# Patient Record
Sex: Male | Born: 1980 | Race: White | Hispanic: No | Marital: Single | Smoking: Never smoker
Health system: Southern US, Community
[De-identification: ages and names within clinical notes are randomized; demographics above are authoritative.]

## PROBLEM LIST (undated history)

## (undated) DIAGNOSIS — K219 Gastro-esophageal reflux disease without esophagitis: Secondary | ICD-10-CM

## (undated) DIAGNOSIS — T8859XA Other complications of anesthesia, initial encounter: Secondary | ICD-10-CM

## (undated) DIAGNOSIS — M419 Scoliosis, unspecified: Secondary | ICD-10-CM

## (undated) DIAGNOSIS — E781 Pure hyperglyceridemia: Secondary | ICD-10-CM

## (undated) DIAGNOSIS — F79 Unspecified intellectual disabilities: Secondary | ICD-10-CM

## (undated) DIAGNOSIS — T148XXA Other injury of unspecified body region, initial encounter: Secondary | ICD-10-CM

## (undated) DIAGNOSIS — F209 Schizophrenia, unspecified: Secondary | ICD-10-CM

## (undated) DIAGNOSIS — Z9889 Other specified postprocedural states: Secondary | ICD-10-CM

## (undated) DIAGNOSIS — T4145XA Adverse effect of unspecified anesthetic, initial encounter: Secondary | ICD-10-CM

## (undated) HISTORY — PX: WISDOM TOOTH EXTRACTION: SHX21

## (undated) HISTORY — DX: Unspecified intellectual disabilities: F79

## (undated) HISTORY — DX: Other specified postprocedural states: Z98.890

## (undated) HISTORY — PX: TONSILLECTOMY AND ADENOIDECTOMY: SHX28

## (undated) HISTORY — DX: Scoliosis, unspecified: M41.9

## (undated) HISTORY — PX: BACK SURGERY: SHX140

## (undated) HISTORY — PX: EYE SURGERY: SHX253

---
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
## 2005-10-25 ENCOUNTER — Encounter: Admission: RE | Admit: 2005-10-25 | Discharge: 2005-10-25 | Payer: Self-pay | Admitting: Orthopaedic Surgery

## 2007-06-18 IMAGING — CT CT L SPINE W/O CM
4 of 5 series · 16 of 33 positions shown, 19 images · IV contrast (agent unspecified)
Comparison: None.
COMPARISON: None.

CLINICAL DATA: Chronic back pain post Harrington rod fusion, 0886, for scoliosis.
THORACIC SPINE CT WITHOUT CONTRAST:
TECHNIQUE: Multidetector CT imaging of the thoracic spine was performed.  Multiplanar CT images were also generated.
TECHNIQUE: Multidetector CT imaging of the lumbar spine was performed.  Multiplanar CT image reconstructions were also generated.

[Series 3: recon 2: l-spine helical · axial · 0.27mm/px · z∈[-276,-103]mm · 2 of 209 slices shown]
[im 70/209  bone]
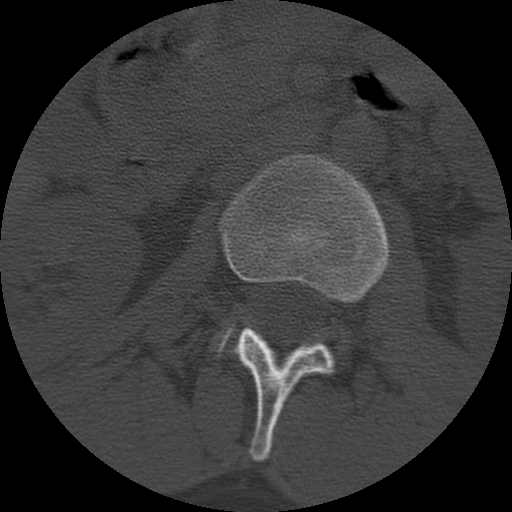
[im 139/209  bone]
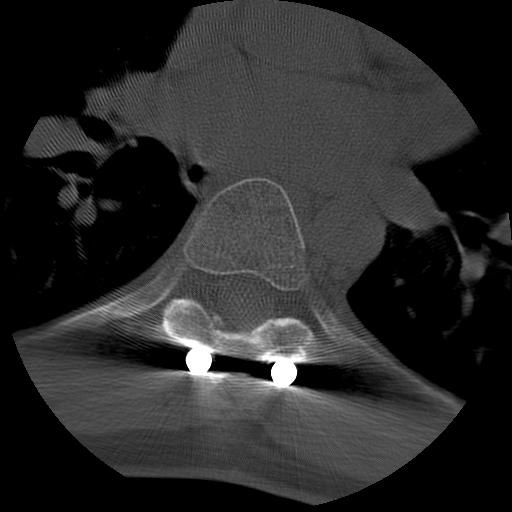

[Series 4: recon 3: l-spine helical · axial · 0.27mm/px · z∈[-374,-3]mm · 6 of 417 slices shown, 8 images]
[im 60/417  soft-tissue]
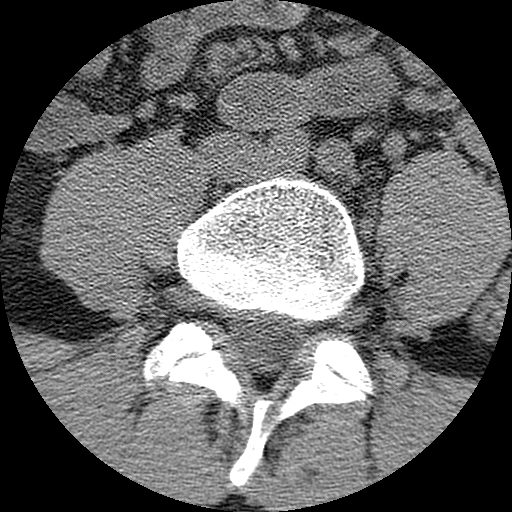
[im 60/417  bone]
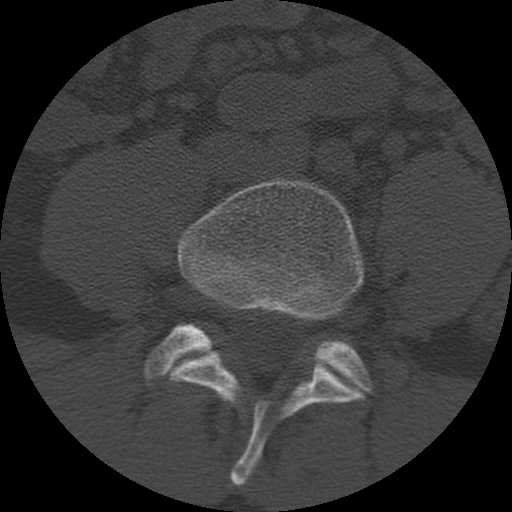
[im 119/417  bone]
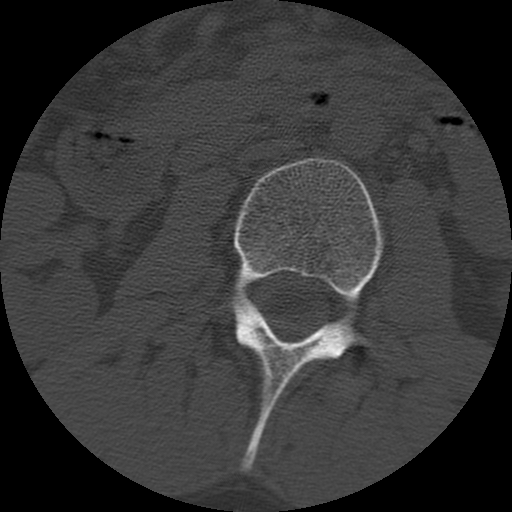
[im 179/417  bone]
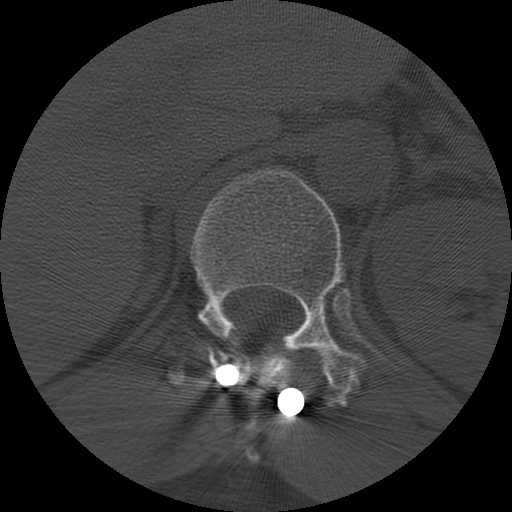
[im 238/417  bone]
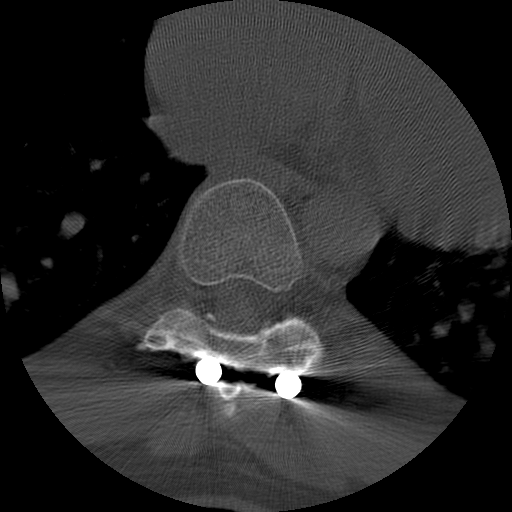
[im 298/417  soft-tissue]
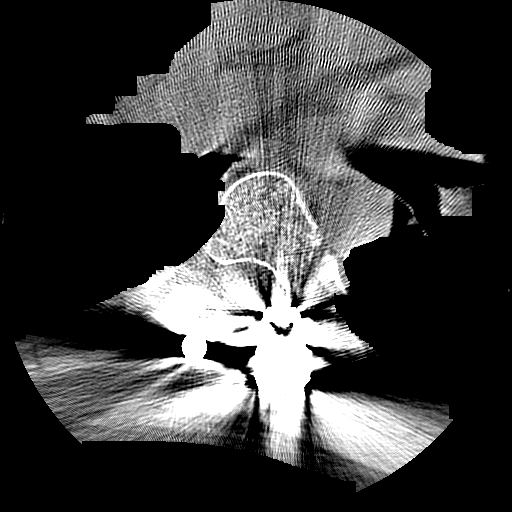
[im 298/417  bone]
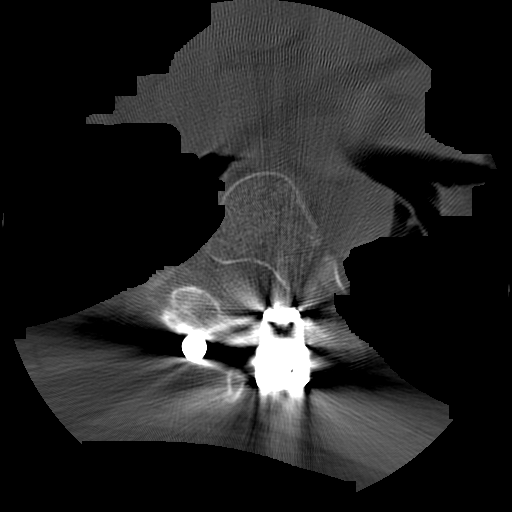
[im 357/417  bone]
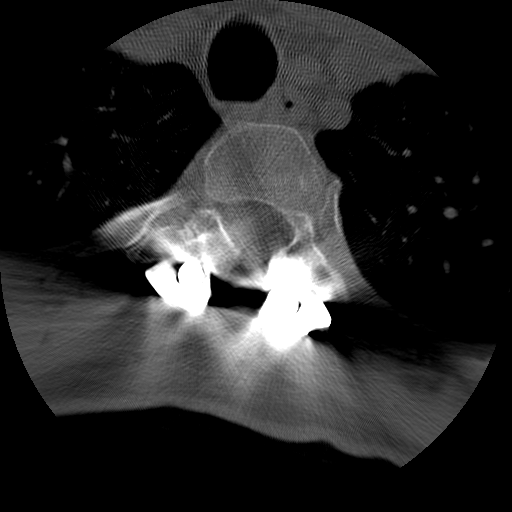

[Series 251: reformatted · sagittal · 1.02mm/px · 5 of 40 slices shown, 6 images (1 of 2)]
[im 14/40  bone]
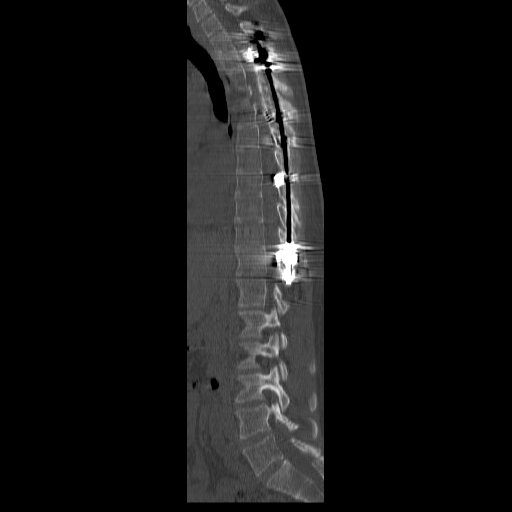
[im 17/40  bone]
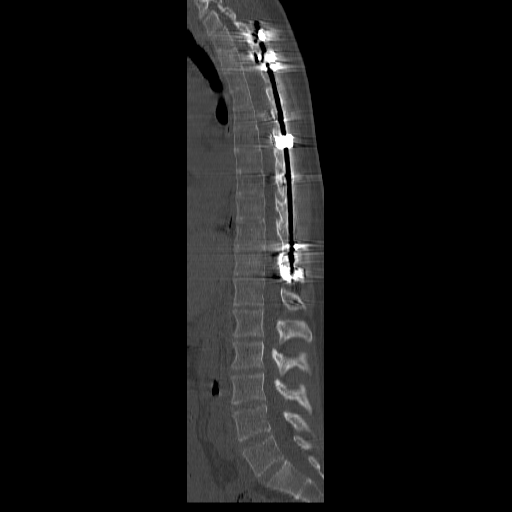
[im 20/40  soft-tissue]
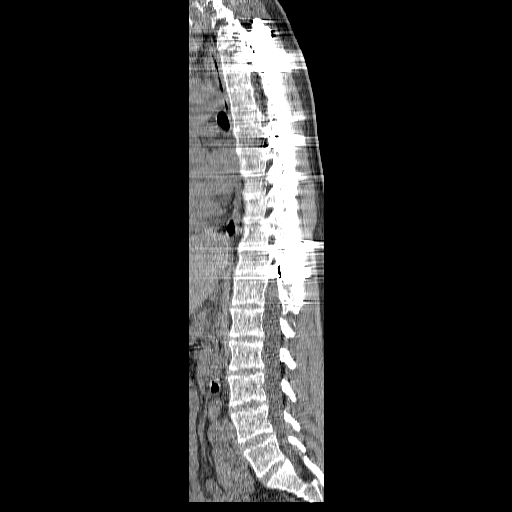
[im 20/40  bone]
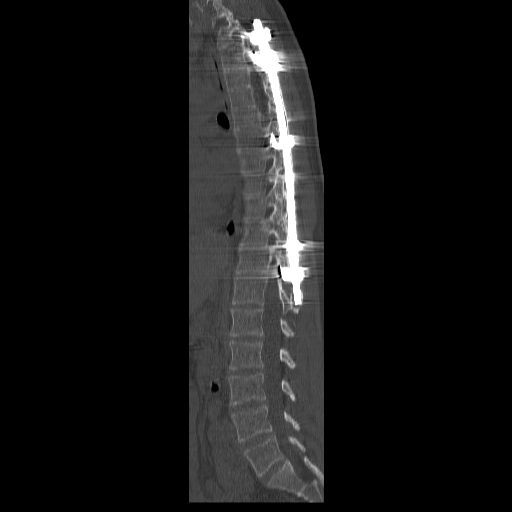
[im 23/40  bone]
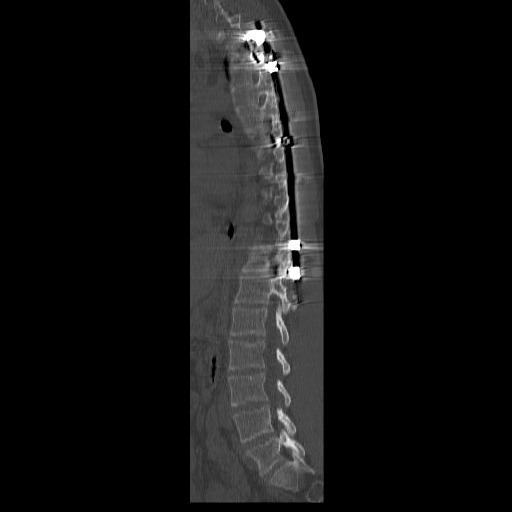
[im 27/40  bone]
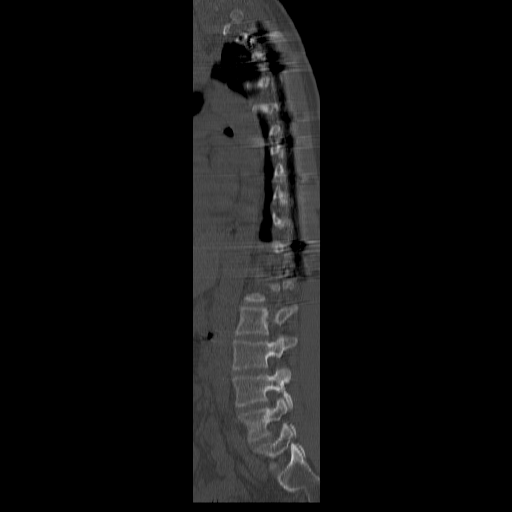

[Series 252: reformatted · coronal · 1.02mm/px · 3 of 40 slices shown (2 of 2)]
[im 8/40  bone]
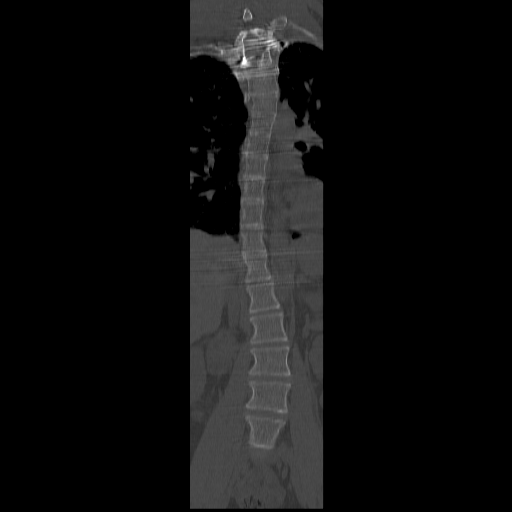
[im 16/40  bone]
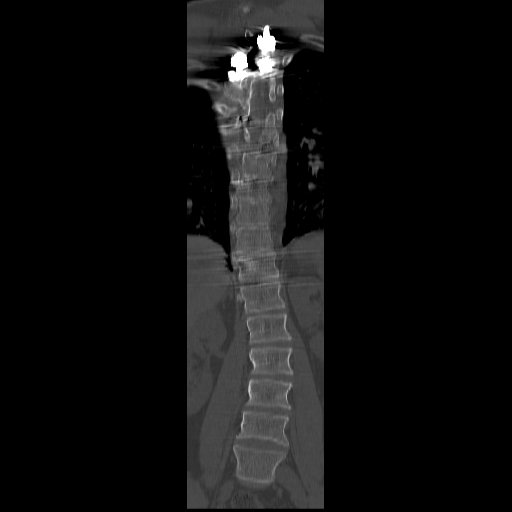
[im 24/40  bone]
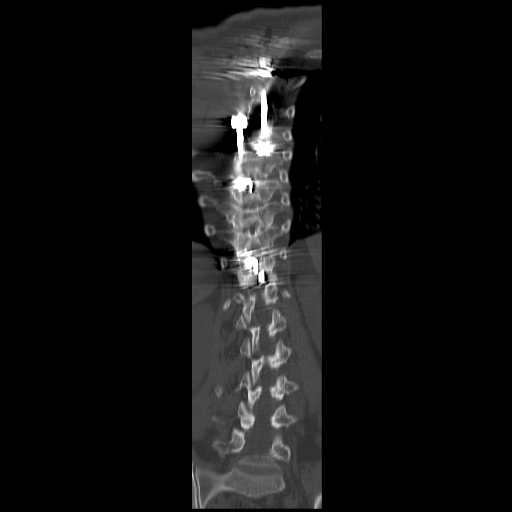

[16 of 33 positions shown; findings below may reference images not displayed]

Lira distraction rods are seen from the level of T1 through T11 with slight levorotary scoliosis at the lumbar spine and dextroscoliosis of the thoracic spine.  Streak artifact from the Lira distraction rods is most marked at the T1, T2, T6, T8, and T11 levels.  Allowing for this, no significant osseous lesion nor central canal stenosis/significant neural impingement is seen.  No paravertebral abnormality is seen by CT assessment.
IMPRESSION: 1.  Thoracic distraction Harrington rods with slight dextroscoliosis of thoracic spine.
2.  No significant abnormality seen allowing for Harrington rod metallic streak artifact.
LUMBAR SPINE CT WITHOUT CONTRAST:
T12-L1, L1-2, L2-3, L3-4:  The disks appear normal.  No neural impingement nor acquired spinal stenosis nor significant osseous abnormality is seen.
L4-5:  Slight diffuse annular disk bulge is seen with no significant neural impingement nor acquired spinal stenosis.  No significant osseous abnormality is seen.
L5-S1:  The disk is normal appearing with no neural impingement, acquired spinal stenosis, nor significant osseous abnormality.  
Posterior vertebral alignment is normally maintained with slight levorotary scoliosis and no significant soft tissue abnormality in the paravertebral region.
IMPRESSION: 1.  Slight levoscoliosis.
2.  Otherwise no significant abnormality.

## 2008-05-04 ENCOUNTER — Emergency Department (HOSPITAL_COMMUNITY): Admission: EM | Admit: 2008-05-04 | Discharge: 2008-05-04 | Payer: Self-pay | Admitting: Emergency Medicine

## 2009-03-13 ENCOUNTER — Emergency Department (HOSPITAL_COMMUNITY): Admission: EM | Admit: 2009-03-13 | Discharge: 2009-03-14 | Payer: Self-pay | Admitting: Emergency Medicine

## 2009-03-14 ENCOUNTER — Inpatient Hospital Stay (HOSPITAL_COMMUNITY): Admission: AD | Admit: 2009-03-14 | Discharge: 2009-03-17 | Payer: Self-pay | Admitting: Psychiatry

## 2009-03-14 ENCOUNTER — Ambulatory Visit: Payer: Self-pay | Admitting: Psychiatry

## 2010-07-21 LAB — DIFFERENTIAL
Basophils Absolute: 0 10*3/uL (ref 0.0–0.1)
Eosinophils Absolute: 0.1 10*3/uL (ref 0.0–0.7)
Eosinophils Relative: 1 % (ref 0–5)
Monocytes Absolute: 0.8 10*3/uL (ref 0.1–1.0)
Monocytes Relative: 9 % (ref 3–12)
Neutro Abs: 4.7 10*3/uL (ref 1.7–7.7)

## 2010-07-21 LAB — CBC
Hemoglobin: 14.6 g/dL (ref 13.0–17.0)
Platelets: 267 10*3/uL (ref 150–400)
RBC: 4.53 MIL/uL (ref 4.22–5.81)

## 2010-07-21 LAB — BASIC METABOLIC PANEL
Calcium: 9.3 mg/dL (ref 8.4–10.5)
Chloride: 102 mEq/L (ref 96–112)
Creatinine, Ser: 0.79 mg/dL (ref 0.4–1.5)

## 2010-07-21 LAB — ETHANOL: Alcohol, Ethyl (B): <5 mg/dL (ref 0–10)

## 2010-07-21 LAB — URINALYSIS, ROUTINE W REFLEX MICROSCOPIC
Glucose, UA: NEGATIVE mg/dL
Leukocytes, UA: NEGATIVE
Specific Gravity, Urine: 1.029 (ref 1.005–1.030)
Urobilinogen, UA: 2 mg/dL — ABNORMAL HIGH (ref 0.0–1.0)
pH: 6.5 (ref 5.0–8.0)

## 2010-07-21 LAB — RAPID URINE DRUG SCREEN, HOSP PERFORMED
Barbiturates: NOT DETECTED
Cocaine: NOT DETECTED

## 2010-07-21 LAB — URINE MICROSCOPIC-ADD ON

## 2010-08-02 LAB — DIFFERENTIAL
Basophils Absolute: 0 10*3/uL (ref 0.0–0.1)
Basophils Relative: 0 % (ref 0–1)
Lymphocytes Relative: 32 % (ref 12–46)
Lymphs Abs: 2.6 10*3/uL (ref 0.7–4.0)
Neutro Abs: 4.7 10*3/uL (ref 1.7–7.7)

## 2010-08-02 LAB — COMPREHENSIVE METABOLIC PANEL
AST: 23 U/L (ref 0–37)
BUN: 6 mg/dL (ref 6–23)
CO2: 27 mEq/L (ref 19–32)
Calcium: 9.5 mg/dL (ref 8.4–10.5)
Creatinine, Ser: 0.78 mg/dL (ref 0.4–1.5)
GFR calc Af Amer: >60 mL/min (ref >60)
Potassium: 3.6 mEq/L (ref 3.5–5.1)
Sodium: 138 mEq/L (ref 135–145)
Total Bilirubin: 1.8 mg/dL — ABNORMAL HIGH (ref 0.3–1.2)
Total Protein: 7 g/dL (ref 6.0–8.3)

## 2010-08-02 LAB — RAPID URINE DRUG SCREEN, HOSP PERFORMED
Barbiturates: NOT DETECTED
Opiates: NOT DETECTED

## 2010-08-02 LAB — CBC
HCT: 43.3 % (ref 39.0–52.0)
Hemoglobin: 14.5 g/dL (ref 13.0–17.0)
MCHC: 33.4 g/dL (ref 30.0–36.0)
MCV: 95.2 fL (ref 78.0–100.0)
RBC: 4.54 MIL/uL (ref 4.22–5.81)

## 2011-05-26 ENCOUNTER — Other Ambulatory Visit: Payer: Self-pay | Admitting: Family Medicine

## 2011-05-26 ENCOUNTER — Ambulatory Visit (INDEPENDENT_AMBULATORY_CARE_PROVIDER_SITE_OTHER): Payer: Medicare Other | Admitting: Family Medicine

## 2011-05-26 VITALS — BP 146/81 | HR 94 | Temp 98.0°F | Resp 16 | Ht 70.5 in | Wt 166.6 lb

## 2011-05-26 DIAGNOSIS — R634 Abnormal weight loss: Secondary | ICD-10-CM

## 2011-05-26 DIAGNOSIS — F79 Unspecified intellectual disabilities: Secondary | ICD-10-CM

## 2011-05-26 DIAGNOSIS — R631 Polydipsia: Secondary | ICD-10-CM

## 2011-05-26 DIAGNOSIS — R350 Frequency of micturition: Secondary | ICD-10-CM

## 2011-05-26 LAB — POCT CBC
HCT, POC: 42.1 % — AB (ref 43.5–53.7)
MCV: 93.4 fL (ref 80–97)
POC MID %: 7.5 %M (ref 0–12)
Platelet Count, POC: 276 10*3/uL (ref 142–424)

## 2011-05-26 LAB — POCT GLYCOSYLATED HEMOGLOBIN (HGB A1C): Hemoglobin A1C: 5.3
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
## 2011-05-28 LAB — COMPREHENSIVE METABOLIC PANEL
AST: 18 U/L (ref 0–37)
Albumin: 5 g/dL (ref 3.5–5.2)
Alkaline Phosphatase: 68 U/L (ref 39–117)
Creat: 0.89 mg/dL (ref 0.50–1.35)
Glucose, Bld: 96 mg/dL (ref 70–99)
Potassium: 4.2 mEq/L (ref 3.5–5.3)
Sodium: 141 mEq/L (ref 135–145)

## 2011-05-30 ENCOUNTER — Encounter: Payer: Self-pay | Admitting: Family Medicine

## 2011-05-31 LAB — TSH: TSH: 1.89 u[IU]/mL (ref 0.350–4.500)

## 2011-06-18 ENCOUNTER — Inpatient Hospital Stay (HOSPITAL_COMMUNITY)
Admission: RE | Admit: 2011-06-18 | Discharge: 2011-06-24 | DRG: 885 | Disposition: A | Discharge status: Home / Self Care | Payer: No Typology Code available for payment source | Source: Ambulatory Visit | Attending: Psychiatry | Admitting: Psychiatry

## 2011-06-18 ENCOUNTER — Encounter (HOSPITAL_COMMUNITY): Payer: Self-pay | Admitting: *Deleted

## 2011-06-18 ENCOUNTER — Emergency Department (HOSPITAL_COMMUNITY)
Admission: EM | Admit: 2011-06-18 | Discharge: 2011-06-18 | Disposition: A | Discharge status: Other Acute Inpatient Hospital | Payer: Medicare Other | Attending: Emergency Medicine | Admitting: Emergency Medicine

## 2011-06-18 ENCOUNTER — Encounter (HOSPITAL_COMMUNITY): Payer: Self-pay | Admitting: Emergency Medicine

## 2011-06-18 DIAGNOSIS — M4120 Other idiopathic scoliosis, site unspecified: Secondary | ICD-10-CM

## 2011-06-18 DIAGNOSIS — Z79899 Other long term (current) drug therapy: Secondary | ICD-10-CM

## 2011-06-18 DIAGNOSIS — F29 Unspecified psychosis not due to a substance or known physiological condition: Secondary | ICD-10-CM | POA: Insufficient documentation

## 2011-06-18 DIAGNOSIS — F79 Unspecified intellectual disabilities: Secondary | ICD-10-CM | POA: Insufficient documentation

## 2011-06-18 DIAGNOSIS — F200 Paranoid schizophrenia: Principal | ICD-10-CM

## 2011-06-18 DIAGNOSIS — Z91018 Allergy to other foods: Secondary | ICD-10-CM

## 2011-06-18 LAB — CBC
HCT: 45 % (ref 39.0–52.0)
MCH: 32.1 pg (ref 26.0–34.0)
MCHC: 35.8 g/dL (ref 30.0–36.0)
RBC: 5.02 MIL/uL (ref 4.22–5.81)
RDW: 11.8 % (ref 11.5–15.5)
WBC: 11.5 10*3/uL — ABNORMAL HIGH (ref 4.0–10.5)

## 2011-06-18 LAB — URINE MICROSCOPIC-ADD ON

## 2011-06-18 LAB — URINALYSIS, ROUTINE W REFLEX MICROSCOPIC
Bilirubin Urine: NEGATIVE
Glucose, UA: NEGATIVE mg/dL
Nitrite: NEGATIVE

## 2011-06-18 LAB — BASIC METABOLIC PANEL
Calcium: 9.5 mg/dL (ref 8.4–10.5)
Chloride: 99 mEq/L (ref 96–112)
GFR calc Af Amer: >90 mL/min (ref >90)
Glucose, Bld: 109 mg/dL — ABNORMAL HIGH (ref 70–99)

## 2011-06-18 LAB — RAPID URINE DRUG SCREEN, HOSP PERFORMED
Opiates: NOT DETECTED
Tetrahydrocannabinol: NOT DETECTED

## 2011-06-18 MED ORDER — RISPERIDONE 2 MG PO TABS
2.0000 mg | ORAL_TABLET | Freq: Every day | ORAL | Status: DC
  Administered 2011-06-18 – 2011-06-23 (×6): 2 mg via ORAL
  Filled 2011-06-18: qty 1
  Filled 2011-06-18: qty 10
  Filled 2011-06-18 (×6): qty 1

## 2011-06-18 MED ORDER — ZOLPIDEM TARTRATE 5 MG PO TABS: 5.0000 mg | ORAL_TABLET | Freq: Every evening | ORAL | Status: DC | PRN

## 2011-06-18 MED ORDER — MAGNESIUM HYDROXIDE 400 MG/5ML PO SUSP: 30.0000 mL | Freq: Every day | ORAL | Status: DC | PRN

## 2011-06-18 MED ORDER — MAGNESIUM HYDROXIDE 400 MG/5ML PO SUSP
30.0000 mL | Freq: Every day | ORAL | Status: DC | PRN
  Filled 2011-06-18: qty 30

## 2011-06-18 MED ORDER — ALUM & MAG HYDROXIDE-SIMETH 200-200-20 MG/5ML PO SUSP: 30.0000 mL | ORAL | Status: DC | PRN

## 2011-06-18 MED ORDER — BENZTROPINE MESYLATE 1 MG PO TABS: 2.0000 mg | ORAL_TABLET | Freq: Four times a day (QID) | ORAL | Status: DC | PRN

## 2011-06-18 MED ORDER — ACETAMINOPHEN 325 MG PO TABS: 650.0000 mg | ORAL_TABLET | ORAL | Status: DC | PRN

## 2011-06-18 MED ORDER — IBUPROFEN 600 MG PO TABS
600.0000 mg | ORAL_TABLET | Freq: Three times a day (TID) | ORAL | Status: DC | PRN
  Administered 2011-06-22 – 2011-06-23 (×3): 600 mg via ORAL
  Filled 2011-06-18 (×3): qty 1

## 2011-06-18 MED ORDER — LORAZEPAM 1 MG PO TABS: 1.0000 mg | ORAL_TABLET | Freq: Three times a day (TID) | ORAL | Status: DC | PRN

## 2011-06-18 MED ORDER — NICOTINE 21 MG/24HR TD PT24: 21.0000 mg | MEDICATED_PATCH | Freq: Every day | TRANSDERMAL | Status: DC

## 2011-06-18 MED ORDER — POTASSIUM CHLORIDE CRYS ER 20 MEQ PO TBCR
40.0000 meq | EXTENDED_RELEASE_TABLET | Freq: Once | ORAL | Status: AC
  Administered 2011-06-18: 40 meq via ORAL
  Filled 2011-06-18: qty 2

## 2011-06-18 MED ORDER — CIPROFLOXACIN HCL 500 MG PO TABS
500.0000 mg | ORAL_TABLET | Freq: Two times a day (BID) | ORAL | Status: DC
  Administered 2011-06-18 – 2011-06-21 (×7): 500 mg via ORAL
  Filled 2011-06-18 (×8): qty 1

## 2011-06-18 MED ORDER — ACETAMINOPHEN 325 MG PO TABS: 650.0000 mg | ORAL_TABLET | Freq: Four times a day (QID) | ORAL | Status: DC | PRN

## 2011-06-18 MED ORDER — CIPROFLOXACIN HCL 500 MG PO TABS
500.0000 mg | ORAL_TABLET | Freq: Once | ORAL | Status: AC
  Administered 2011-06-18: 500 mg via ORAL
  Filled 2011-06-18: qty 1

## 2011-06-18 MED ORDER — ONDANSETRON HCL 4 MG PO TABS: 4.0000 mg | ORAL_TABLET | Freq: Three times a day (TID) | ORAL | Status: DC | PRN

## 2011-06-18 MED ORDER — IBUPROFEN 600 MG PO TABS: 600.0000 mg | ORAL_TABLET | Freq: Three times a day (TID) | ORAL | Status: DC | PRN

## 2011-06-18 MED ORDER — CIPROFLOXACIN HCL 500 MG PO TABS: 500.0000 mg | ORAL_TABLET | Freq: Two times a day (BID) | ORAL | Status: DC

## 2011-06-18 NOTE — ED Notes (Signed)
Greg CSW into see 
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
## 2011-06-18 NOTE — ED Notes (Signed)
1 bag of belongings sent w/ pt 

## 2011-06-18 NOTE — ED Notes (Signed)
[facility] dept here to transport  
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
## 2011-06-18 NOTE — ED Notes (Signed)
Up to the bathroom 

## 2011-06-18 NOTE — ED Notes (Signed)
Pt ambulatory w/ [facility] to [facility]
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
## 2011-06-18 NOTE — Progress Notes (Signed)
Patient ID: Andrew Conley, male   DOB: January 14, 1981, 31 y.o.   MRN: [id number] The patient has a flat affect. He makes only brief eye contact when he speaks. Admits to auditory hallucinations that he believes are coming from a chip implanted in his tooth. He feels taking medicine is pointless and his only hope of getting relief is to have someone remove the chip. He reports that he read an article in [employer] where a woman had a similar thing happen to her and how she had the chip removed, so he knows it is possible. Although he does not believe that medicine will help him, he was compliant with taking medication. Encouraged to increase fluid intake due to his h/o UTI.

## 2011-06-18 NOTE — ED Notes (Signed)
MD at bedside. 
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
## 2011-06-18 NOTE — Progress Notes (Signed)
Patient ID: NIKAN ELLINGSON, male   DOB: 12-04-1980, 31 y.o.   MRN: [id number]  [facility] Notes:  (Counselor/Nursing/MHT/Case Management/Adjunct)  06/18/2011 11 AM  Type of Therapy:  Group Therapy, Dance/Movement Therapy   Participation Level:  Did Not Attend     Rhunette Croft

## 2011-06-19 DIAGNOSIS — F200 Paranoid schizophrenia: Secondary | ICD-10-CM

## 2011-06-19 NOTE — [facility] Counselor (Signed)
Adult Comprehensive Assessment  Patient ID: Andrew Conley, male   DOB: 08-19-1980, 31 y.o.   MRN: [id number]  Information Source: Information source: Patient  Current Stressors:  Educational / Learning stressors: H.S diploma, attended community college Employment / Job issues: Unemployed-disabled Family Relationships: Pt. reports no problems Financial / Lack of resources (include bankruptcy): pt. reports no problems Housing / Lack of housing: Pt. lives with his parents Physical health (include injuries & life threatening diseases): Pt. has no problems Substance abuse: Drink alchol 5 times a week Bereavement / Loss: Pt has no problems  Living/Environment/Situation:  Living Arrangements: Parent Living conditions (as described by patient or guardian): Pt. likes where he lives How long has patient lived in current situation?: 31 years What is atmosphere in current home: Supportive  Family History:  Marital status: Single Does patient have children?: No  Childhood History:  By whom was/is the patient raised?: Both parents Additional childhood history information: Pt. reports a good childhood Description of patient's relationship with caregiver when they were a child: Pt. reports being close to his parents Patient's description of current relationship with people who raised him/her: Pt. reports good relationship Does patient have siblings?: Yes Number of Siblings: 1  (Brother) Description of patient's current relationship with siblings: Pt. 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 and [place]  Employment/Work Situation:   Employment situation: On disability Why is patient on disability: Spinal contion and mental health issues/ and learning disability How long has patient been on disability: 3 years Patient's job has been impacted by current illness: No 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 reports drinking occasionally If attempted suicide, did drugs/alcohol play a role in this?: No Alcohol/Substance Abuse Treatment Hx: Denies past history If yes, describe treatment: No problems Has alcohol/substance abuse ever caused legal problems?: No  Social Support System:   Patient's Community Support System: Fair Museum/gallery exhibitions officer System: Pt. reports fair support Type of faith/religion: Metoodist How does patient's faith help to cope with current illness?: Pt. attends church and says it helps  Leisure/Recreation:   Leisure and Hobbies: Social research officer, government,   Strengths/Needs:   What things does the patient do well?: Gardening In what areas does patient struggle / problems for patient: Pt. reports no struggles  Discharge Plan:   Does patient have access to transportation?: Yes (Pt.'s parents will pick up at d/c) Will patient be returning to same living situation after discharge?: Yes Currently receiving community mental health services: No If no, would patient like referral for  services when discharged?: Yes (What county?) (Pt. lives in [place].)  Does patient have financial barriers related to discharge medications?: No  Summary/Recommendations:   Summary and Recommendations (to be completed by the evaluator): Pt. is a 31 year old male admitted for parinoia. Pt. does not have a doctor or therapist but would like a referral for one. Pt. lives in [place]. Pt. recommendations include: crisis stablization, case mangement, group therpy, and medication management.  Andrew Conley [place]. 06/19/2011
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
## 2011-06-19 NOTE — H&P (Signed)
  Pt was seen by me today and I agree with the key elements documented in H&P.  

## 2011-06-19 NOTE — Progress Notes (Signed)
Pt is calm and cooperative   He has been quiet and has limited interaction with others  He attends and minimally participates in groups  He is guarded and suspicious  He is eating meals and snacks and has taken his medications    Verbal support given  Medications administered and effectiveness monitored  Q 15 min checks  Pt safe

## 2011-06-19 NOTE — [facility] Suicide Risk Assessment (Signed)
Suicide Risk Assessment  Admission Assessment     Demographic factors:  Assessment Details Time of Assessment: Admission Information Obtained From: Patient Current Mental Status:  Current Mental Status: Thoughts of violence towards others Loss Factors:  Loss Factors:  (unble to assess) Historical Factors:  Historical Factors: Impulsivity, history to threatning other in past Risk Reduction Factors:  Risk Reduction Factors: Sense of responsibility to family;Religious beliefs about death;Living with another person, especially a relative;Positive social support, family support  CLINICAL FACTORS:   Previous Psychiatric Diagnoses and Treatments  COGNITIVE FEATURES THAT CONTRIBUTE TO RISK:  Closed-mindedness    SUICIDE RISK:   Mild:  Suicidal ideation of limited frequency, intensity, duration, and specificity.  There are no identifiable plans, no associated intent, mild dysphoria and related symptoms, good self-control (both objective and subjective assessment), few other risk factors, and identifiable protective factors, including available and accessible social support.  PLAN OF CARE:  Daily contact with patient to assess and evaluate symptoms and progress in treatment  Medication management.  Currently on antibiotics for uti.  Wonda Cerise 06/19/2011, 10:51 PM

## 2011-06-19 NOTE — Progress Notes (Signed)
[facility] Adult Inpatient Family/Significant Other Suicide Prevention Education  Suicide Prevention Education:  Contact Attempts:  Darel Hong [phone]-(mother)has been identified by the patient as the family member/significant other with whom the patient will be residing, and identified as the person(s) who will aid the patient in the event of a mental health crisis.  With written consent from the patient, two attempts were made to provide suicide prevention education, prior to and/or following the patient's discharge.  We were unsuccessful in providing suicide prevention education.  A suicide education pamphlet was given to the patient to share with family/significant other.  Date and time of first attempt:  By Lamar Blinks on 06/19/11 at 5:10 p.m. Date and time of second attempt:  Neila Gear 06/19/2011, 5:13 PM

## 2011-06-19 NOTE — Progress Notes (Signed)
Patient ID: Andrew Conley, male   DOB: [birth year], 31 y.o.   MRN: [id number] Pt. attended and participated in aftercare planning group. Pt. accepted information on suicide prevention, warning signs to look for with suicide and crisis line numbers to use. The pt. agreed to call crisis line numbers if having warning signs or having thoughts of suicide. Pt. listed their current anxiety level as good and depression as low. Pt. accepted information on mental health meetings ([facility]MI).

## 2011-06-19 NOTE — Progress Notes (Signed)
Patient ID: Andrew Conley, male   DOB: [birth year], 31 y.o.   MRN: [id number]  [facility] Group Notes:  (Counselor/Nursing/MHT/Case Management/Adjunct)  06/19/2011 11 AM  Type of Therapy:  Group Therapy, Dance/Movement Therapy   Participation Level:  Minimal  Participation Quality:  Inattentive  Affect:  Depressed  Cognitive:  Delusional  Insight:  Limited  Engagement in Group:  Limited  Engagement in Therapy:  Limited  Modes of Intervention:  Clarification, Problem-solving, Role-play, Socialization and Support  Summary of Progress/Problems:  Therapist discussed the definition of supports.  Therapist asked group to describe their idea of support.  Patient stated that support means, " talking with familyl".     Rhunette Croft

## 2011-06-19 NOTE — Progress Notes (Signed)
Patient ID: Andrew Conley, male   DOB: 11/20/1980, 31 y.o.   MRN: [id number] The patient is less anxious and is getting more comfortable with staff and on the unit. Admits to still hearing voices. Has not expressed any paranoid delusions this evening. Spent most of the evening in the day room watching TV. No interaction with others initiated. Pleasant and compliant with his medication.

## 2011-06-20 DIAGNOSIS — F200 Paranoid schizophrenia: Principal | ICD-10-CM
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
## 2011-06-20 NOTE — Progress Notes (Signed)
[facility] Group Notes:  (Counselor/Nursing/MHT/Case Management/Adjunct)  06/20/2011 2:18 PM  Type of Therapy:  Group Therapy  Participation Level:  Minimal  Participation Quality:  Attentive and Sharing  Affect:  Blunted  Cognitive:  Oriented  Insight:  Limited  Engagement in Group:  Limited  Engagement in Therapy:  Limited  Modes of Intervention:  Support and Exploration  Summary of Progress/Problems: Patient stated that he was here because he was having out of body experiences. Also stated that he had family conflict, but then could not identify problems with his family.   Danyell Shader, Aram Beecham 06/20/2011, 2:18 PM
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
## 2011-06-20 NOTE — Progress Notes (Signed)
06/20/2011  Time: 0930   Group Topic/Focus: The focus of this group is on discussing various styles of communication and communicating assertively using 'I' (feeling) statements.   Participation Level:  Active  Participation Quality:  Attentive  Affect:  Blunted  Cognitive:  Oriented  Additional Comments: None.   Andrew Conley  06/20/2011 1:16 PM

## 2011-06-21 MED ORDER — CIPROFLOXACIN HCL 500 MG PO TABS
500.0000 mg | ORAL_TABLET | Freq: Two times a day (BID) | ORAL | Status: DC
  Administered 2011-06-21 – 2011-06-24 (×7): 500 mg via ORAL
  Filled 2011-06-21 (×11): qty 1

## 2011-06-21 NOTE — Tx Team (Signed)
Interdisciplinary Treatment Plan Update (Adult)  Date:  06/21/2011  Time Reviewed:  10:15AM-11:00AM  Progress in Treatment: Attending groups:  Yes Participating in groups:    Yes, pleasant and cooperative Taking medication as prescribed:    Yes Tolerating medication:   Yes Family/Significant other contact made:  Yes, with mother Patient understands diagnosis:   No, limited insight and intellectual ability to understand Discussing patient identified problems/goals with staff:   Yes, limited Medical problems stabilized or resolved:   Yes Denies suicidal/homicidal ideation:  Yes Issues/concerns per patient self-inventory:   Ankle's hurting Other:    New problem(s) identified: Yes, Describe:  Patient would like to learn what his triggers are, wonders if it could be foods that he eats  Reason for Continuation of Hospitalization: Medication stabilization Other; describe   would like to learn what causes his anger, is paranoid that somebody puts things in his drinks, food, etc.  Interventions implemented related to continuation of hospitalization:  Medication monitoring and adjustment, safety checks Q15 min., suicide risk assessment, group therapy, psychoeducation, collateral contact, aftercare planning, ongoing physician assessments, medication education  Additional comments:  Not applicable  Estimated length of stay:  3-5 days  Discharge Plan:  Will return to home to live with parents and brother, follow up needs to be arranged, possibly with [facility]):  Not applicable  Review of initial/current patient goals per problem list:   1.  Goal(s):  Reduce auditory hallucinations and paranoia to baseline.  Met:  No  Target date:  By Discharge   As evidenced by:  Although patient denies these symptoms, more time of observation is needed to ensure  2.  Goal(s):  Determine how / if to address substance abuse.  Met:  No  Target date:  By Discharge   As evidenced by:   Patient denies drinking any more than 1 drink (1 ounce) daily, not sure if wants to address  3.  Goal(s):  Establish aftercare plan.  Met:  No  Target date:  By Discharge   As evidenced by:  Interested in possibly going to [facility] for socialization, willing to try, CM trying to get appointment for assessment  4.  Goal(s):  Determine collateral/parents are prepared for patient to return home.  Met:  No  Target date:  By Discharge   As evidenced by:  Still needed  Attendees: Patient:  Andrew Conley  06/21/2011 10:15AM-11:00AM  Family:     Physician:  Dr. Harvie Heck Readling 06/21/2011 10:15AM-11:00AM  Nursing:   Neill Loft, RN 06/21/2011 10:15AM -11:00AM   Case Manager:  Ambrose Mantle, LCSW 06/21/2011 10:15AM-11:00AM  Counselor:  Veto Kemps, MT-BC 06/21/2011 10:15AM-11:00AM  Other:      Other:      Other:      Other:       Scribe for Treatment Team:   Sarina Ser, 06/21/2011, 10:15AM-11:15AM

## 2011-06-21 NOTE — Tx Team (Signed)
Initial Interdisciplinary Treatment Plan  PATIENT STRENGTHS: (choose at least two) Physical Health Supportive family/friends  PATIENT STRESSORS: Educational concerns Marital or family conflict Occupational concerns   PROBLEM LIST: Problem List/Patient Goals Date to be addressed Date deferred Reason deferred Estimated date of resolution  psychosis 06-21-11                                                      DISCHARGE CRITERIA:  Adequate post-discharge living arrangements Improved stabilization in mood, thinking, and/or behavior Reduction of life-threatening or endangering symptoms to within safe limits  PRELIMINARY DISCHARGE PLAN: Attend aftercare/continuing care group Participate in family therapy  PATIENT/FAMIILY INVOLVEMENT: This treatment plan has been presented to and reviewed with the patient, ISSACC MERLO, and/or family member.  The patient and family have been given the opportunity to ask questions and make suggestions.  Mickeal Needy 06/21/2011, 5:00 AM

## 2011-06-21 NOTE — Progress Notes (Signed)
Patient ID: Andrew Conley, male   DOB: 1980/11/08, 31 y.o.   MRN: [id number] Pt attended He reports poor sleep treatment team and says he would like to know why he gets angry and says sometimes he can feel it coming and sometimes he can't. He reports poor sleep and good appetite.  He says that he thinks he might enjoy going to a day program and making friends and learning skills.  Pt continues to display level mood and be cooperative.

## 2011-06-21 NOTE — Progress Notes (Signed)
[facility] Group Notes:  (Counselor/Nursing/MHT/Case Management/Adjunct)  06/21/2011 2:02 PM  Type of Therapy:  Group Therapy  Participation Level:  Minimal  Participation Quality:  Attentive  Affect:  Blunted  Cognitive:  Oriented  Insight:  Limited  Engagement in Group:  Limited  Engagement in Therapy:  Limited  Modes of Intervention:  Education, Problem-solving, Support and Exploration  Summary of Progress/Problems: Patient was  Quiet but attentive. When asked about opportunities for more social interaction, he stated that he would be interested in this.   Andrew Conley, Aram Beecham 06/21/2011, 2:02 PM
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
## 2011-06-21 NOTE — Progress Notes (Signed)
Patient ID: Andrew Conley, male   DOB: September 29, 1980, 31 y.o.   MRN: [id number] Pt. Cooperative, attended group. Pt. Denies SHI. Pt. Reports "feel better today, sleep better, good meal, want get better acquainted with myself see what cause me to act different, things in my life problems that brought me here". Staff will continue to monitor q41min for safety.
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
## 2011-06-21 NOTE — Progress Notes (Signed)
Patient ID: Andrew Conley, male   DOB: December 15, 1980, 31 y.o.   MRN: [id number] (D) Pt. Awake, alert, NAD.  Appropriately groomed and dressed.  Soft spoken.  Affect and mood are anxious.  Affect is also blunted.  (A) Discussed nursing plan of care.    (R) Pt. Denies SI/HI.  Denies A/V hallucinations.  Reports that Cipro is helping his UTI symptoms, he denies any burning or urinary frequency.  He states that he has significantly increased his water intake.

## 2011-06-22 NOTE — Progress Notes (Signed)
Patient ID: Andrew Conley, male   DOB: [birth year], 31 y.o.   MRN: [id number] Pt is awake and interacting in the day room this PM. Pt mood is depressed and affect is flat. Pt denies SI VH but endorses AH. Pt hears voices but not with commands. Pt is interacting well with other pt's in the milieu and is cooperative with staff. Pt forwards little in conversation but seems to be in good spirits today. Writer will continue to monitor.

## 2011-06-22 NOTE — Progress Notes (Signed)
Patient ID: Andrew Conley, male   DOB: [birth year], 31 y.o.   MRN: [id number] Called mother to set up appointment with treatment team at 11:00, 06/23/11. This meeting will be to discuss expectations and consequences of behaviors once patient returns home. Instructed parents to come up with these expectations before the meeting as well as consequences. Reported to mother that the discussion with patient on the need for medications and follow up has been addressed in groups and with individual staff. He has said he is in agreement with recommendations. Discussed follow up appointments and medications as part of discharge plans. Mother does not have guardianship but was interested in this. She will ask case manager in meeting about how to start process. Also reported to mother, that according to case manager's note, their insurance company was recommending that they be his payee to control his spending on alcohol.

## 2011-06-22 NOTE — Progress Notes (Signed)
06/22/2011  Time: 0930   Group Topic/Focus: The focus of this group is on discussing various aspects of wellness, balancing those aspects and exploring ways to increase the ability to experience wellness.   Participation Level:  Active  Participation Quality:  Attentive  Affect:  Blunted  Cognitive:  Alert   Additional Comments: None.   Andrew Conley  06/22/2011 1:07 PM

## 2011-06-22 NOTE — Progress Notes (Signed)
[facility] Group Notes:  (Counselor/Nursing/MHT/Case Management/Adjunct)  06/22/2011 1:40 PM  Type of Therapy:  Group Therapy  Participation Level:  Active  Participation Quality:  Attentive and Sharing  Affect:  Blunted  Cognitive:  Oriented  Insight:  Limited  Engagement in Group:  Good  Engagement in Therapy:  Good  Modes of Intervention:  Education, Problem-solving and Support  Summary of Progress/Problems: Patient talked about not taking medications because he didn't think he needed them or they weren't doing any good. He was asked if he saw the significance of taking these now. He stated he would. He was attentive to discussion about how when people are not taking medications, their behavior effects other family member, specifically with him his parents. Patient tried to say that he did not have follow up, but was confronted that he was supposed to go to [employer]. He stated he did once but it didn't do any good. Talked about the likelihood of one session being of benefit. Agreed to go to follow up. Reminded him that he was going to jeopardize his living situation if her was not compliant.   [name], Aram Beecham 06/22/2011, 1:40 PM

## 2011-06-22 NOTE — Discharge Planning (Signed)
Met with patient in Aftercare Planning Group.   Patient again presented as calm, cooperative, and pleasant.  He has not yet identified what triggers his anger, but is willing to go to therapy to work on this.  Case Manager reminded him that he has historically not followed up with outpatient care, and asked him what would be different this time.  He said that he apparently needs the medication and would trust his family if they told him that he needed it.  Case Manager asked him if he thought that he could continue to live in the home if he would not comply with treatment and kept getting angry and explosive, and he replied "probably not."  CM did insurance review and they are in agreement that a family session is needed wherein limits will be set by family, to include compliance with medication and treatment.  Also they recommend that patient be assigned a payee for his money so that he does not have access to the money to spend on alcohol all the time.  During Aftercare Planning Group, Case Manager provided education about support groups, what they are and why they are beneficial. Discussion was held with group about different kinds of support groups available and how they can assist with patient's recovery plans.  Patient asked if CM could find out if there is a support group for [employer].  Also during Aftercare Planning Group, Case Manager provided psychoeducation on "Suicide Prevention Information." This included descriptions of risk factors for suicide, warning signs that an individual is in crisis and thinking of suicide, and what to do if this occurs. Pt indicated understanding of information provided, and will read brochure given upon discharge. Patients were all very interactive in this discussion.  Awaiting call back from [facility].  Ambrose Mantle, LCSW 06/22/2011, 11:38 AM

## 2011-06-22 NOTE — Progress Notes (Signed)
Pt resting in bed with eyes closed.  No distress observed.  Safety maintained with q15 minute checks. 

## 2011-06-22 NOTE — Progress Notes (Signed)
Patient ID: Andrew Conley, male   DOB: 1980/11/15, 31 y.o.   MRN: [id number] [place] is fully alert, and denies any auditory hallucinations, is in full contact with reality, pleasant and polite.  I asked him what he thought needed to happen when he went back to his family, after his episode of acting out - which precipitated this admission.   He readily said he needed to be more social at home, talk about his feelings, and not let things get to him. Admits that the voices have been really bad, when his mood is bad.  And he feels like his mood was very bad before hospitalization and now his mood is really good.  He is sleeping good, Anxiety 8/10, and his depression is an 8/10.  No intent to harm himself.  When I ask him about apologizing to his family - he readily agrees that he should tell them he is sorry and says he wants to do this.  I encourage him to write these things down - so he can speak about this more easily when the time comes.  Discussed the possibility of a family session with his parents tomorrow to talk things out.    Plan: Family session, continue current plan.

## 2011-06-22 NOTE — Progress Notes (Signed)
Patient rate depression as a 2.  Goal is to make some new friends.  Complained of back pain that is chronic and does not go below a 3.  Ibuprofen given for pain.  Offered support and 15 minute checks.  Safety maintained of unit.

## 2011-06-23 NOTE — Progress Notes (Signed)
Currently resting quietly in bed with eyes closed. Respirations even and unlabored. No acute distress noted. Safety has been maintained with Q15 minute observation. Will continue current POC 

## 2011-06-23 NOTE — Progress Notes (Signed)
[facility] MD Progress Note  06/23/2011 2:10 PM  Diagnosis:  Axis I: Schizophrenia - Paranoid Type.   The patient was seen today and reports the following:   ADL's: Intact.  Sleep: The patient continues to report to sleeping well last night.  Appetite: The patient reports a good appetite.   Mild>(1-10) >Severe  Hopelessness (1-10): 0  Depression (1-10): 0  Anxiety (1-10): 0   Suicidal Ideation: The patient adamantly denies any suicidal ideations today.  Plan: No.  Intent: No.  Means: No.   Homicidal Ideation: The patient adamantly denies any homicidal ideations.  Plan: No  Intent: No.  Means: No   General Appearance /Behavior: Casual and cooperative today.  Eye Contact: Good.  Speech: Appropriate in rate and volume with no pressuring noted.  Motor Behavior: Appropriate.  Level of Consciousness: Alert and Oriented x 3.  Mental Status: Alert and Oriented x 3.  Mood: Essentially Euthymic.  Affect: Mildly Constricted.  Anxiety Level: No anxiety noted or reported today.  Thought Process: wnl.  Thought Content: The patient denies any auditory or visual hallucinations. He also denies any delusional thinking. Perception:. wnl.  Judgment: Fair to Good.  Insight: Fair to Good.  Cognition: Oriented to time, place and person.  Sleep:  Number of Hours: 6.75    Vital Signs:Blood pressure 132/74, pulse 102, temperature 97.1 F (36.2 C), temperature source Oral, resp. rate 16, height 5\' 8"  (1.727 m), weight 73.483 kg (162 lb), SpO2 99.00%.  Current Medications: Current Facility-Administered Medications  Medication Dose Route Frequency Provider Last Rate Last Dose  . acetaminophen (TYLENOL) tablet 650 mg  650 mg Oral Q6H PRN Wonda Cerise, MD      . alum & mag hydroxide-simeth (MAALOX/MYLANTA) 200-200-20 MG/5ML suspension 30 mL  30 mL Oral Q4H PRN Wonda Cerise, MD      . benztropine (COGENTIN) tablet 2 mg  2 mg Oral Q6H PRN Wonda Cerise, MD      . ciprofloxacin (CIPRO) tablet 500 mg  500 mg Oral  BID Franchot Gallo, MD   500 mg at 06/23/11 1024  . ibuprofen (ADVIL,MOTRIN) tablet 600 mg  600 mg Oral Q8H PRN Wonda Cerise, MD   600 mg at 06/22/11 2141  . LORazepam (ATIVAN) tablet 1 mg  1 mg Oral Q8H PRN Wonda Cerise, MD      . magnesium hydroxide (MILK OF MAGNESIA) suspension 30 mL  30 mL Oral Daily PRN Wonda Cerise, MD      . ondansetron [facility]) tablet 4 mg  4 mg Oral Q8H PRN Wonda Cerise, MD      . risperiDONE (RISPERDAL) tablet 2 mg  2 mg Oral QHS Franchot Gallo, MD   2 mg at 06/22/11 2140  . zolpidem (AMBIEN) tablet 5 mg  5 mg Oral QHS PRN Wonda Cerise, MD       Lab Results: No results found for this or any previous visit (from the past 48 hour(s)).  Time was spent today discussing with the patient his current symptoms.  The patient states he feels he is doing well and hopes to go home soon.  The treatment team also met with the patient and his parents with his parents expressing concerns about his use of alcohol.  The patient agreed to allow his parent to remove all alcohol from the house with the exception of some "extract" he was making for cooking.  He also agreed to take all medications as prescribed at time of discharge and to keep follow up appointments.  Treatment Plan  Summary:  1. Daily contact with patient to assess and evaluate symptoms and progress in treatment  2. Medication management  3. The patient will deny suicidal ideations or homicidal ideations for 48 hours prior to discharge and have a depression and anxiety rating of 3 or less. The patient will also deny any auditory or visual hallucinations or delusional thinking.  4. The patient will deny any symptoms of substance withdrawal at time of discharge.   Plan:  1. Will continue the patient's current medications.  2. Will continue the medication Risperdal at 2 mgs po qhs for psychosis.  3. Will continue the medication Cipro 500 mgs po BID for his UTI.  4. Will continue to monitor. 5. Likely discharge in the morning.  Andrew Conley 06/23/2011, 2:10 PM

## 2011-06-23 NOTE — Progress Notes (Signed)
Pt's thinking is clearer. He reports no si, hi or hallucinations. When writer asked pt about microchip, pt reports that he does not have a microchip. He reports that his mood has improved. Offered support, medication and 15 minute checks. Safety maintained on unit.

## 2011-06-23 NOTE — Progress Notes (Signed)
Pt. Denies SI/HI and denies A/V hallucinations.  Pt. Calm and cooperative.  No issues or concerns voiced at present.

## 2011-06-23 NOTE — Tx Team (Signed)
Interdisciplinary Treatment Plan Update (Adult)  Date:  06/23/2011  Time Reviewed:  11:16 AM   Progress in Treatment: Attending groups:  Yes Participating in groups:    Yes, fully engaged Taking medication as prescribed:    Yes Tolerating medication:   Yes Family/Significant other contact made:  Yes with parents, who were included in this meeting Patient understands diagnosis:   Did not know what his diagnosis was, this was shared during Treatment Team Discussing patient identified problems/goals with staff:   Yes Medical problems stabilized or resolved:   Yes Denies suicidal/homicidal ideation:  Yes Issues/concerns per patient self-inventory:   None Other:    New problem(s) identified: No, Describe:    Reason for Continuation of Hospitalization: Other; describe Continued stabilization   Interventions implemented related to continuation of hospitalization:  Medication monitoring and adjustment, safety checks Q15 min., suicide risk assessment, group therapy, psychoeducation, collateral contact, aftercare planning, ongoing physician assessments, medication education  Additional comments:  Not applicable  Estimated length of stay:   1 day  Discharge Plan:  Will return to live with his parents and brother, follow up with [facility] and [facility]  New goal(s):  Not applicable  Review of initial/current patient goals per problem list:   1.  Goal(s):  Reduce auditory hallucinations and paranoia to baseline.  Met:  Yes  Target date:  By Discharge   As evidenced by:  Denies any AH and paranoia  2.  Goal(s):  Determine how / if to address substance abuse.  Met:  Yes  Target date:  By Discharge   As evidenced by:  Has been signed up to go to therapy  3.  Goal(s):  Establish aftercare plan.  Met:  Yes  Target date:  By Discharge   As evidenced by:  Follow up is set with PSR and therapy/med mgmt  4.  Goal(s):  Determine collateral/parents are prepared for  patient to return home.  Met:  Yes  Target date:  By Discharge   As evidenced by:  After much discussion it was agreed that parents will pour out the "huge stash" of alcohol that patient has in the home in his room and the basement, except for extractions he has made for cooking purposes which he does not want them to pour out.  He said repeatedly he does not want to drink if it such a "big deal for you".  Also discussed mother becoming his payee because he has a spending problem -- cannot buy just one of anything.  This will be pursued at discharge.  Attendees: Patient:  Andrew Conley  06/23/2011 11:16 AM   Family:  Vevelyn Francois, parents 06/23/2011 11:15 AM   Physician:  Dr. Harvie Heck Readling 06/23/2011 11:16 AM   Nursing:   Edwyna Shell, RN 06/23/2011 11:16 AM   Case Manager:  Ambrose Mantle, LCSW 06/23/2011 11:16 AM   Counselor:  Veto Kemps, MT-BC 06/23/2011 11:16 AM   Other:   Lynann Bologna, NP 06/23/2011 11:16 AM   Other:      Other:      Other:       Scribe for Treatment Team:   Sarina Ser, 06/23/2011, 11:16 AM

## 2011-06-23 NOTE — Progress Notes (Signed)
[facility] Group Notes:  (Counselor/Nursing/MHT/Case Management/Adjunct)  06/23/2011 1:34 PM  Type of Therapy:  Group Therapy  Participation Level:  Active  Participation Quality:  Attentive and Sharing  Affect:  Blunted  Cognitive:  Oriented  Insight:  Limited  Engagement in Group:  Good  Engagement in Therapy:  Good  Modes of Intervention:  Education, Problem-solving and Support  Summary of Progress/Problems: Patient stated that he feels good today. Could not identify with others who were expressing a lot of frustration. He is eager to have family session today with treatment team and to go home possibly on Friday. He plans to take his medications and go to his appointments. He also stated that he knows he needs to talk to family more about his feelings. Eager to go to [facility] and support groups. Counselor also suggested his work with a Veterinary surgeon to help him express his feelings.   Marshia Tropea, Aram Beecham 06/23/2011, 1:34 PM

## 2011-06-24 MED ORDER — RISPERIDONE 2 MG PO TABS: 2.0000 mg | ORAL_TABLET | Freq: Every day | ORAL | Status: DC

## 2011-06-24 NOTE — Progress Notes (Signed)
[facility] Group Notes:  (Counselor/Nursing/MHT/Case Management/Adjunct)  06/24/2011 1:57 PM  Type of Therapy:  Group Therapy  Participation Level:  Active  Participation Quality:  Attentive and Sharing  Affect:  Blunted  Cognitive:  Oriented  Insight:  Good  Engagement in Group:  Good  Engagement in Therapy:  Good  Modes of Intervention:  Education, Problem-solving and Support  Summary of Progress/Problems: Patient stated that he is doing well and is eager to go home. Sounds motivated to make changes and felt the family session went well. Plans to take medications and follow up with counseling and [facility]    Andrew Conley 06/24/2011, 1:57 PM

## 2011-06-24 NOTE — Progress Notes (Signed)
[facility] Case Management Discharge Plan:  Will you be returning to the same living situation after discharge: Yes,  will live with parents At discharge, do you have transportation home?:Yes,  parents to pick up Do you have the ability to pay for your medications:Yes,  insurance and income  Interagency Information:     Release of information consent forms completed and in the chart;  Patient's signature needed at discharge.  Patient to Follow up at:  Follow-up Information    Follow up with [facility] - INTAKE on 06/27/2011. (11AM appointment - please be on time.  This is where you will go as you wish Monday-Friday for Psychosocial Rehabilitation.)    Contact information:   [facility] [address] [place]  [address] Telephone:  [phone]      Follow up with [facility] - INTAKE on 06/27/2011. (2PM appointment - please be on time.  This is where you will receive your medication management and therapy.)    Contact information:   [facility] [address] [place]  [address] Telephone:  [phone]         Patient denies SI/HI:   Yes,      Safety Planning and Suicide Prevention discussed:  Yes,  During Aftercare Planning Group, Case Manager provided psychoeducation on "Suicide Prevention Information."  This included descriptions of risk factors for suicide, warning signs that an individual is in crisis and thinking of suicide, and what to do if this occurs.  Pt indicated understanding of information provided, and will read brochure given upon discharge.     Barrier to discharge identified:No.  Summary and Recommendations:  Patient to follow through on medication management, therapy and psychosocial rehabilitation.  Patient to give up the alcohol.   Sarina Ser 06/24/2011, 4:02 PM

## 2011-06-24 NOTE — [facility] Suicide Risk Assessment (Signed)
Suicide Risk Assessment  Discharge Assessment     Demographic factors:  Assessment Details Time of Assessment: Admission Information Obtained From: Patient Current Mental Status:  AO x 3. Risk Reduction Factors:  Risk Reduction Factors: Sense of responsibility to family;Religious beliefs about death;Living with another person, especially a relative;Positive social support  CLINICAL FACTORS:   Schizophrenia:   Paranoid or undifferentiated type  COGNITIVE FEATURES THAT CONTRIBUTE TO RISK:  Polarized thinking    Diagnosis:  Axis I: Schizophrenia - Paranoid Type.   The patient was seen today and reports the following:   ADL's: Intact.  Sleep: The patient continues to report to sleeping well last night.  Appetite: The patient reports a good appetite.   Mild>(1-10) >Severe  Hopelessness (1-10): 0  Depression (1-10): 0  Anxiety (1-10): 0   Suicidal Ideation: The patient adamantly denies any suicidal ideations today.  Plan: No.  Intent: No.  Means: No.   Homicidal Ideation: The patient adamantly denies any homicidal ideations.  Plan: No  Intent: No.  Means: No   General Appearance /Behavior: Casual and cooperative today.  Eye Contact: Good.  Speech: Appropriate in rate and volume with no pressuring noted.  Motor Behavior: Appropriate.  Level of Consciousness: Alert and Oriented x 3.  Mental Status: Alert and Oriented x 3.  Mood: Remains essentially euthymic.  Affect: Mildly Constricted.  Anxiety Level: No anxiety noted or reported today.  Thought Process: wnl.  Thought Content: The patient denies any auditory or visual hallucinations. He also denies any delusional thinking.  Perception:. wnl.  Judgment: Fair to Good.  Insight: Fair to Good.  Cognition: Oriented to time, place and person.   Lab Results: No results found for this or any previous visit (from the past 48 hour(s)).   Time was spent today discussing with the patient his current symptoms. The patient again  states he feels he is doing well with no depression, anxiety, auditory hallucinations or paranoid delusions.  The patient states that he believes the treatment team meeting with him and his parents yesterday was very helpful and he is hopefull he and his mother can come to an agreement about him disposing of his large collection of liquor in the home. The patient expresses appreciation for the care he received while at [facility].  Treatment Plan Summary:  1. Daily contact with patient to assess and evaluate symptoms and progress in treatment  2. Medication management  3. The patient will deny suicidal ideations or homicidal ideations for 48 hours prior to discharge and have a depression and anxiety rating of 3 or less. The patient will also deny any auditory or visual hallucinations or delusional thinking.  4. The patient will deny any symptoms of substance withdrawal at time of discharge.   Plan:  1. Will continue the patient's current medications.  2. Will continue to monitor.  3. Will discharge today to outpatient follow up.  SUICIDE RISK:   Minimal: No identifiable suicidal ideation.  Patients presenting with no risk factors but with morbid ruminations; may be classified as minimal risk based on the severity of the depressive symptoms  Burrell Hodapp 06/24/2011, 1:17 PM

## 2011-06-24 NOTE — Tx Team (Signed)
Interdisciplinary Treatment Plan Update (Adult)  Date:  06/24/2011  Time Reviewed:  10:15AM-11:00AM  Progress in Treatment: Attending groups:  Yes Participating in groups:    Yes, fully engaged and cooperative, eager Taking medication as prescribed:    Yes, no refusals Tolerating medication:   Yes, no side effects reported by patient or noted by staff Family/Significant other contact made: Yes, parents were in Tx Tm yesterday  Patient understands diagnosis:   Yes Discussing patient identified problems/goals with staff:   Yes Medical problems stabilized or resolved:   Yes Denies suicidal/homicidal ideation:  Yes Issues/concerns per patient self-inventory:   None Other:    New problem(s) identified: None  Reason for Continuation of Hospitalization: None  Interventions implemented related to continuation of hospitalization:  Medication monitoring and adjustment, safety checks Q15 min., suicide risk assessment, group therapy, psychoeducation, collateral contact, aftercare planning, ongoing physician assessments, medication education - all until discharge  Additional comments:  Parents are prepared to accept patient back into the home.  They have had time to get rid of the extensive collection of alcohol.  Estimated length of stay:  Discharge today  Discharge Plan:  Live with parents & brother, follow up with [facility] goal(s):  Not applicable  Review of initial/current patient goals per problem list:   1.  Goal(s):  All goals met yesterday.  Met:  Yes  Target date:  By Discharge   As evidenced by:  See 3/7 Tx Plan Update.    Attendees: Patient:  Andrew Conley  06/24/2011 10:15AM-11:00AM  Family:     Physician:  Dr. Harvie Heck Readling 06/24/2011 10:15AM-11:00AM  Nursing:   Edwyna Shell, RN 06/24/2011 10:15AM -11:00AM   Case Manager:  Ambrose Mantle, LCSW 06/24/2011 10:15AM-11:00AM  Counselor:  Veto Kemps, MT-BC 06/24/2011 10:15AM-11:00AM    Other:   Lynann Bologna, NP 06/24/2011 10:15AM-11:00AM  Other:      Other:      Other:       Scribe for Treatment Team:   Sarina Ser, 06/24/2011, 10:15AM-11:15AM

## 2011-06-24 NOTE — Progress Notes (Signed)
Pt d/c from hospital with parents. All items returned. D/C instructions given, prescription given and samples given. Pt denies si and hi.

## 2011-06-24 NOTE — Progress Notes (Signed)
Pt is in bed resting with eyes closed   No distress noted   Q 15 min checks  Pt safe at present 
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
## 2011-06-28 NOTE — Progress Notes (Signed)
Patient Discharge Instructions:  Psychiatric Admission Assessment Note Faxed,  06/28/2011 Discharge Summary Note Faxed,   06/28/2011 After Visit Summary (AVS) Faxed,  06/28/2011 Face Sheet Faxed, 06/28/2011 Faxed to the Next Level Care provider:  06/28/2011  Faxed to [facility] @ [phone] And to [facility] @ [phone]  Wandra Scot, 06/28/2011, 5:42 PM

## 2012-09-25 ENCOUNTER — Ambulatory Visit (HOSPITAL_COMMUNITY): Payer: Self-pay | Admitting: Physician Assistant

## 2013-06-16 ENCOUNTER — Ambulatory Visit (INDEPENDENT_AMBULATORY_CARE_PROVIDER_SITE_OTHER): Payer: Medicare Other | Admitting: Emergency Medicine

## 2013-06-16 ENCOUNTER — Telehealth: Payer: Self-pay | Admitting: *Deleted

## 2013-06-16 ENCOUNTER — Other Ambulatory Visit: Payer: Self-pay | Admitting: Physician Assistant

## 2013-06-16 VITALS — BP 122/70 | HR 83 | Temp 97.5°F | Resp 18 | Ht 70.4 in | Wt 193.1 lb

## 2013-06-16 DIAGNOSIS — H6120 Impacted cerumen, unspecified ear: Secondary | ICD-10-CM

## 2013-06-16 DIAGNOSIS — H60399 Other infective otitis externa, unspecified ear: Secondary | ICD-10-CM

## 2013-06-16 MED ORDER — CIPROFLOXACIN-HYDROCORTISONE 0.2-1 % OT SUSP: 3.0000 [drp] | Freq: Two times a day (BID) | OTIC | Status: DC

## 2013-06-16 NOTE — Progress Notes (Signed)
[facility] [address], [place] [address] [phone]  Date:  06/16/2013   Name:  Andrew Conley   DOB:  1980/09/01   MRN:  [id number]  PCP:  No primary provider on file.    Chief Complaint: Otalgia   History of Present Illness:  Andrew Conley is a 33 y.o. very pleasant male patient who presents with the following:  Ill past two days with pain in right ear.  No fever or chills, sore throat, cough or coryza.  No nausea or vomiting.  No improvement with over the counter medications or other home remedies. Denies other complaint or health concern today.   Patient Active Problem List   Diagnosis Date Noted  . Schizophrenia, paranoid type 06/19/2011    Class: Acute  . Mentally disabled 05/26/2011    Past Medical History  Diagnosis Date  . Previous back surgery 1999  . Mentally disabled   . Scoliosis     Past Surgical History  Procedure Laterality Date  . Tonsillectomy and adenoidectomy    . Eye surgery      33 year old  . Back surgery      History  Substance Use Topics  . Smoking status: Never Smoker   . Smokeless tobacco: Never Used  . Alcohol Use: No     Comment: occassionally     No family history on file.  No Known Allergies  Medication list has been reviewed and updated.  No current outpatient prescriptions on file prior to visit.   No current facility-administered medications on file prior to visit.    Review of Systems:  As per HPI, otherwise negative.    Physical Examination: Filed Vitals:   06/16/13 1400  BP: 122/70  Pulse: 83  Temp: 97.5 F (36.4 C)  Resp: 18   Filed Vitals:   06/16/13 1400  Height: 5' 10.4" (1.788 m)  Weight: 193 lb 2 oz (87.601 kg)   Body mass index is 27.4 kg/(m^2). Ideal Body Weight: Weight in (lb) to have BMI = 25: 175.9  GEN: WDWN, NAD, Non-toxic, A & O x 3 HEENT: Atraumatic, Normocephalic. Neck supple. No masses, No LAD. Ears and Nose: No external deformity.  Bilateral cerumen  impaction CV: RRR, No M/G/R. No JVD. No thrill. No extra heart sounds. PULM: CTA B, no wheezes, crackles, rhonchi. No retractions. No resp. distress. No accessory muscle use. ABD: S, NT, ND, +BS. No rebound. No HSM. EXTR: No c/c/e NEURO Normal gait.  PSYCH: Normally interactive. Conversant. Not depressed or anxious appearing.  Calm demeanor.    Assessment and Plan: Bilateral cerumen impaction cipro hc  Signed,  Phillips OdorJeffery Adil Tugwell, MD

## 2013-06-16 NOTE — Patient Instructions (Signed)
Cerumen Impaction °A cerumen impaction is when the wax in your ear forms a plug. This plug usually causes reduced hearing. Sometimes it also causes an earache or dizziness. Removing a cerumen impaction can be difficult and painful. The wax sticks to the ear canal. The canal is sensitive and bleeds easily. If you try to remove a heavy wax buildup with a cotton tipped swab, you may push it in further. °Irrigation with water, suction, and small ear curettes may be used to clear out the wax. If the impaction is fixed to the skin in the ear canal, ear drops may be needed for a few days to loosen the wax. People who build up a lot of wax frequently can use ear wax removal products available in your local drugstore. °SEEK MEDICAL CARE IF:  °You develop an earache, increased hearing loss, or marked dizziness. °Document Released: 05/12/2004 Document Revised: 06/27/2011 Document Reviewed: 07/02/2009 °ExitCare® Patient Information ©2014 [employer]. ° °Otitis Externa °Otitis externa is a bacterial or fungal infection of the outer ear canal. This is the area from the eardrum to the outside of the ear. Otitis externa is sometimes called "swimmer's ear." °CAUSES  °Possible causes of infection include: °· Swimming in dirty water. °· Moisture remaining in the ear after swimming or bathing. °· Mild injury (trauma) to the ear. °· Objects stuck in the ear (foreign body). °· Cuts or scrapes (abrasions) on the outside of the ear. °SYMPTOMS  °The first symptom of infection is often itching in the ear canal. Later signs and symptoms may include swelling and redness of the ear canal, ear pain, and yellowish-white fluid (pus) coming from the ear. The ear pain may be worse when pulling on the earlobe. °DIAGNOSIS  °Your caregiver will perform a physical exam. A sample of fluid may be taken from the ear and examined for bacteria or fungi. °TREATMENT  °Antibiotic ear drops are often given for 10 to 14 days. Treatment may also include pain  medicine or corticosteroids to reduce itching and swelling. °PREVENTION  °· Keep your ear dry. Use the corner of a towel to absorb water out of the ear canal after swimming or bathing. °· Avoid scratching or putting objects inside your ear. This can damage the ear canal or remove the protective wax that lines the canal. This makes it easier for bacteria and fungi to grow. °· Avoid swimming in lakes, polluted water, or poorly chlorinated pools. °· You may use ear drops made of rubbing alcohol and vinegar after swimming. Combine equal parts of white vinegar and alcohol in a bottle. Put 3 or 4 drops into each ear after swimming. °HOME CARE INSTRUCTIONS  °· Apply antibiotic ear drops to the ear canal as prescribed by your caregiver. °· Only take over-the-counter or prescription medicines for pain, discomfort, or fever as directed by your caregiver. °· If you have diabetes, follow any additional treatment instructions from your caregiver. °· Keep all follow-up appointments as directed by your caregiver. °SEEK MEDICAL CARE IF:  °· You have a fever. °· Your ear is still red, swollen, painful, or draining pus after 3 days. °· Your redness, swelling, or pain gets worse. °· You have a severe headache. °· You have redness, swelling, pain, or tenderness in the area behind your ear. °MAKE SURE YOU:  °· Understand these instructions. °· Will watch your condition. °· Will get help right away if you are not doing well or get worse. °Document Released: 04/04/2005 Document Revised: 06/27/2011 Document Reviewed: 04/21/2011 °ExitCare®   Information 2014 [employer].  

## 2013-06-16 NOTE — Telephone Encounter (Signed)
Pt mom called and stated that ear drops was $400.  Per Maralyn SagoSarah if pt having no symptoms he can wait on ear drops and to call if pt starts to have any symptoms.  Pt mom notified

## 2013-06-18 NOTE — Telephone Encounter (Signed)
Received PA on the cipro otic drops and called pharm to see if any alternatives were listed on rejection and advise pt is not going to fill Rx unless he develops Sxs. Pharm stated no alternatives listed but usually cortisporin is covered if pt needs it.

## 2017-05-23 DIAGNOSIS — R69 Illness, unspecified: Secondary | ICD-10-CM | POA: Diagnosis not present

## 2017-06-06 DIAGNOSIS — M47816 Spondylosis without myelopathy or radiculopathy, lumbar region: Secondary | ICD-10-CM | POA: Diagnosis not present

## 2017-06-06 DIAGNOSIS — M4125 Other idiopathic scoliosis, thoracolumbar region: Secondary | ICD-10-CM | POA: Diagnosis not present

## 2017-06-06 DIAGNOSIS — M546 Pain in thoracic spine: Secondary | ICD-10-CM | POA: Diagnosis not present

## 2017-06-06 DIAGNOSIS — G894 Chronic pain syndrome: Secondary | ICD-10-CM | POA: Diagnosis not present

## 2017-06-06 DIAGNOSIS — Z79891 Long term (current) use of opiate analgesic: Secondary | ICD-10-CM | POA: Diagnosis not present

## 2017-07-24 DIAGNOSIS — R69 Illness, unspecified: Secondary | ICD-10-CM | POA: Diagnosis not present

## 2017-09-19 DIAGNOSIS — R69 Illness, unspecified: Secondary | ICD-10-CM | POA: Diagnosis not present

## 2017-10-12 DIAGNOSIS — Z1389 Encounter for screening for other disorder: Secondary | ICD-10-CM | POA: Diagnosis not present

## 2017-10-12 DIAGNOSIS — Z0000 Encounter for general adult medical examination without abnormal findings: Secondary | ICD-10-CM | POA: Diagnosis not present

## 2017-10-12 DIAGNOSIS — R69 Illness, unspecified: Secondary | ICD-10-CM | POA: Diagnosis not present

## 2017-10-12 DIAGNOSIS — E781 Pure hyperglyceridemia: Secondary | ICD-10-CM | POA: Diagnosis not present

## 2017-10-12 DIAGNOSIS — K219 Gastro-esophageal reflux disease without esophagitis: Secondary | ICD-10-CM | POA: Diagnosis not present

## 2017-10-12 DIAGNOSIS — M549 Dorsalgia, unspecified: Secondary | ICD-10-CM | POA: Diagnosis not present

## 2017-10-12 DIAGNOSIS — F250 Schizoaffective disorder, bipolar type: Secondary | ICD-10-CM | POA: Diagnosis not present

## 2017-10-24 ENCOUNTER — Emergency Department (HOSPITAL_COMMUNITY)
Admission: EM | Admit: 2017-10-24 | Discharge: 2017-10-24 | Disposition: A | Discharge status: Home / Self Care | Payer: Medicare HMO | Attending: Emergency Medicine | Admitting: Emergency Medicine

## 2017-10-24 ENCOUNTER — Encounter (HOSPITAL_COMMUNITY): Payer: Self-pay | Admitting: Emergency Medicine

## 2017-10-24 ENCOUNTER — Emergency Department (HOSPITAL_COMMUNITY): Payer: Medicare HMO

## 2017-10-24 ENCOUNTER — Other Ambulatory Visit: Payer: Self-pay

## 2017-10-24 DIAGNOSIS — W010XXA Fall on same level from slipping, tripping and stumbling without subsequent striking against object, initial encounter: Secondary | ICD-10-CM | POA: Diagnosis not present

## 2017-10-24 DIAGNOSIS — S42491A Other displaced fracture of lower end of right humerus, initial encounter for closed fracture: Secondary | ICD-10-CM | POA: Diagnosis not present

## 2017-10-24 DIAGNOSIS — Y9302 Activity, running: Secondary | ICD-10-CM | POA: Diagnosis not present

## 2017-10-24 DIAGNOSIS — Y999 Unspecified external cause status: Secondary | ICD-10-CM | POA: Diagnosis not present

## 2017-10-24 DIAGNOSIS — Z79899 Other long term (current) drug therapy: Secondary | ICD-10-CM | POA: Insufficient documentation

## 2017-10-24 DIAGNOSIS — S59901A Unspecified injury of right elbow, initial encounter: Secondary | ICD-10-CM | POA: Diagnosis present

## 2017-10-24 DIAGNOSIS — R69 Illness, unspecified: Secondary | ICD-10-CM | POA: Diagnosis not present

## 2017-10-24 DIAGNOSIS — S42401A Unspecified fracture of lower end of right humerus, initial encounter for closed fracture: Secondary | ICD-10-CM | POA: Diagnosis not present

## 2017-10-24 DIAGNOSIS — F79 Unspecified intellectual disabilities: Secondary | ICD-10-CM | POA: Insufficient documentation

## 2017-10-24 DIAGNOSIS — R0602 Shortness of breath: Secondary | ICD-10-CM | POA: Diagnosis not present

## 2017-10-24 DIAGNOSIS — R600 Localized edema: Secondary | ICD-10-CM | POA: Diagnosis not present

## 2017-10-24 DIAGNOSIS — R52 Pain, unspecified: Secondary | ICD-10-CM

## 2017-10-24 DIAGNOSIS — R079 Chest pain, unspecified: Secondary | ICD-10-CM | POA: Diagnosis not present

## 2017-10-24 DIAGNOSIS — Y929 Unspecified place or not applicable: Secondary | ICD-10-CM | POA: Diagnosis not present

## 2017-10-24 DIAGNOSIS — S52691A Other fracture of lower end of right ulna, initial encounter for closed fracture: Secondary | ICD-10-CM | POA: Diagnosis not present

## 2017-10-24 DIAGNOSIS — S42494A Other nondisplaced fracture of lower end of right humerus, initial encounter for closed fracture: Secondary | ICD-10-CM | POA: Diagnosis not present

## 2017-10-24 MED ORDER — NAPROXEN 500 MG PO TABS: 500.0000 mg | ORAL_TABLET | Freq: Two times a day (BID) | ORAL | 0 refills | Status: DC

## 2017-10-24 MED ORDER — SODIUM CHLORIDE 0.9 % IV BOLUS: 1000.0000 mL | Freq: Once | INTRAVENOUS | Status: DC

## 2017-10-24 MED ORDER — OXYCODONE-ACETAMINOPHEN 5-325 MG PO TABS
1.0000 | ORAL_TABLET | Freq: Once | ORAL | Status: AC
  Administered 2017-10-24: 1 via ORAL
  Filled 2017-10-24: qty 1

## 2017-10-24 MED ORDER — HYDROCODONE-ACETAMINOPHEN 5-325 MG PO TABS: 1.0000 | ORAL_TABLET | Freq: Four times a day (QID) | ORAL | 0 refills | Status: DC | PRN

## 2017-10-24 NOTE — Progress Notes (Signed)
Patient ID: Andrew Conley, male   DOB: Sep 01, 1980, 37 y.o.   MRN: [id number] Reviewed CT.  Dr. Myrene GalasMichael Handy has agreed to take over care and patient should call his office later today or early tomorrow morning so he can see Dr. Carola FrostHandy for surgical scheduling.

## 2017-10-24 NOTE — ED Triage Notes (Signed)
Pt fell this morning while chasing the dog who got loose. Pt c/o right elbow pain. Denies being on blood thinners.

## 2017-10-24 NOTE — Discharge Instructions (Signed)
Take naproxen 2 times a day with meals.  Do not take other anti-inflammatories at the same time open (Advil, Motrin, ibuprofen, Aleve). You may supplement with norco as needed for severe pain.  Keep your arm elevated when able.  Call doctor Handy's office tomorrow to set up an appointment for follow up. Return to the ER if you develop numbness, inability to feel your fingers, color change of your fingers, or any new or concerning symptoms.

## 2017-10-24 NOTE — ED Provider Notes (Signed)
[facility]-EMERGENCY DEPT Provider Note   CSN: [id number] Arrival date & time: 10/24/17  1040     History   Chief Complaint Chief Complaint  Patient presents with  . Arm Injury    HPI Andrew Conley is a 37 y.o. male presenting for evaluation of right elbow pain.  Patient states he was chasing after a dog when he tripped, falling forward and landing on bilateral elbows.  He reports acute onset right elbow pain.  He denies hitting his head or loss of consciousness.  He is not on blood thinners.  He denies numbness or tingling of the hand or arm.  He has been unable to move his arm since.  He took a Tylenol 3 without improvement of symptoms.  He has not had anything else for pain.  Palpation and movement makes it worse, nothing makes it better.  He denies injury elsewhere.  History of chronic back pain and MR.  Parents in room with patient.  HPI  Past Medical History:  Diagnosis Date  . Mentally disabled   . Previous back surgery 1999  . Scoliosis     Patient Active Problem List   Diagnosis Date Noted  . Schizophrenia, paranoid type (HCC) 06/19/2011    Class: Acute  . Mentally disabled 05/26/2011    Past Surgical History:  Procedure Laterality Date  . BACK SURGERY    . EYE SURGERY     37 year old  . TONSILLECTOMY AND ADENOIDECTOMY          Home Medications    Prior to Admission medications   Medication Sig Start Date End Date Taking? Authorizing Provider  acetaminophen-codeine (TYLENOL #3) 300-30 MG tablet Take 1 tablet by mouth every 8 (eight) hours as needed for moderate pain.   Yes [provider]  clonazePAM (KLONOPIN) 0.5 MG tablet Take 0.5 mg by mouth at bedtime.   Yes [provider]  niacin (NIASPAN) 500 MG CR tablet Take 500 mg by mouth at bedtime.  10/17/17  Yes [provider]  risperiDONE (RISPERDAL) 3 MG tablet Take 3 mg by mouth daily. 10/11/17  Yes [provider]  HYDROcodone-acetaminophen  (NORCO/VICODIN) 5-325 MG tablet Take 1 tablet by mouth every 6 (six) hours as needed for severe pain. 10/24/17   Savannaha Stonerock, PA-C  naproxen (NAPROSYN) 500 MG tablet Take 1 tablet (500 mg total) by mouth 2 (two) times daily with a meal. 10/24/17   Dannah Ryles, PA-C    Family History No family history on file.  Social History Social History   Tobacco Use  . Smoking status: Never Smoker  . Smokeless tobacco: Never Used  Substance Use Topics  . Alcohol use: No    Comment: occassionally   . Drug use: No     Allergies   Patient has no known allergies.   Review of Systems Review of Systems  Musculoskeletal: Positive for arthralgias and joint swelling.  All other systems reviewed and are negative.    Physical Exam Updated Vital Signs BP 117/80   Pulse 98   Temp 98.1 F (36.7 C) (Oral)   Resp 18   Ht 6' (1.829 m)   Wt 88.5 kg (195 lb)   SpO2 99%   BMI 26.45 kg/m   Physical Exam  Constitutional: He is oriented to person, place, and time. He appears well-developed and well-nourished. No distress.  HENT:  Head: Normocephalic and atraumatic.  No obvious head injury.  Eyes: Pupils are equal, round, and reactive to  light. Conjunctivae and EOM are normal.  EOMI and PERRLA.  Neck: Normal range of motion.  No tenderness palpation of C-spine.  Cardiovascular: Normal rate, regular rhythm and intact distal pulses.  Pulmonary/Chest: Effort normal and breath sounds normal. He exhibits no tenderness.  Abdominal: Soft. He exhibits no distension. There is no tenderness.  No tenderness to palpation of the abdomen.  Musculoskeletal: He exhibits edema and tenderness.  Swelling of the right elbow with tenderness palpation of medial, lateral, and posterior aspects.  Radial pulses intact bilaterally.  Sensation intact bilaterally.  Full active range of motion of the fingers and wrist with pain at the elbow.  Unable to range elbow due to pain.  Neurological: He is alert and  oriented to person, place, and time. No sensory deficit.  Skin: Skin is warm. Capillary refill takes less than 2 seconds. No rash noted.  Psychiatric: He has a normal mood and affect.  Nursing note and vitals reviewed.    ED Treatments / Results  Labs (all labs ordered are listed, but only abnormal results are displayed) Labs Reviewed - No data to display  EKG None  Radiology Dg Chest 2 View  Result Date: 10/24/2017 CLINICAL DATA:  No chest pain or shortness of breath EXAM: CHEST - 2 VIEW COMPARISON:  None. FINDINGS: There is no focal parenchymal opacity. There is no pleural effusion or pneumothorax. The heart and mediastinal contours are unremarkable. There is posterior spinal fusion hardware noted transfixing the thoracic spine. IMPRESSION: No active cardiopulmonary disease. Electronically Signed   By: Elige Ko   On: 10/24/2017 14:05   Dg Elbow 2 Views Right  Result Date: 10/24/2017 CLINICAL DATA:  37 year old who fell this morning and injured the RIGHT elbow. Initial encounter. EXAM: RIGHT ELBOW - 2 VIEW COMPARISON:  RIGHT forearm x-ray obtained concurrently. No prior imaging. FINDINGS: Complex comminuted multipart fracture involving the distal humerus with volar displacement of a fracture fragment that may represent the capitellum. The fracture extends to the articular surface. The MEDIAL and LATERAL epicondyles appear to be free fragments. No evidence of fracture involving the proximal radius or ulna. IMPRESSION: Complex comminuted intra-articular multipart fracture involving the distal humerus Electronically Signed   By: Hulan Saas M.D.   On: 10/24/2017 11:51   Dg Forearm Right  Result Date: 10/24/2017 CLINICAL DATA:  37 year old who fell this morning and injured the RIGHT elbow. Initial encounter. EXAM: RIGHT FOREARM - 2 VIEW COMPARISON:  RIGHT elbow x-rays obtained concurrently. No prior imaging. FINDINGS: Comminuted distal humerus fracture as detailed on the report of the  concurrent elbow imaging. No fractures involving the radius or ulna. Bone mineral density well-preserved. No intrinsic osseous abnormalities. Visualized wrist joint intact. IMPRESSION: 1. Normal appearing radius and ulna. 2. Comminuted distal humerus fracture as detailed on the report of the concurrent elbow imaging. Electronically Signed   By: Hulan Saas M.D.   On: 10/24/2017 11:46    Procedures Procedures (including critical care time)  Medications Ordered in ED Medications  oxyCODONE-acetaminophen (PERCOCET/ROXICET) 5-325 MG per tablet 1 tablet (1 tablet Oral Given 10/24/17 1243)     Initial Impression / Assessment and Plan / ED Course  I have reviewed the triage vital signs and the nursing notes.  Pertinent labs & imaging results that were available during my care of the patient were reviewed by me and considered in my medical decision making (see chart for details).     Patient presenting for evaluation of right elbow injury.  Physical exam shows obviously swollen  right elbow with tenderness palpation.  Inability to range due to pain.  Patient is neurovascularly intact.  X-ray viewed interpreted by me, shows complex comminuted fracture.  Patient denies injury elsewhere, no other injury identified on exam.  Will give Percocet for pain and consult with orthopedics.  Discussed with Dr. Ophelia CharterYates from orthopedics, who recommends CT of the elbow and chest x-ray.  Following CT, Dr. Ophelia CharterYates discussed with Dr. Carola FrostHandy.  Patient to follow-up with Dr. Carola FrostHandy.  Per Dr. Ophelia CharterYates, Dr Carola FrostHandy has been given information about imaging and patient injury. Pt to be placed in posterior elbow splint. Will d/c with norco, nsaids, and f/u info. Plan discussed with pt and parents. At this time, pt appears safe for d/c. Return precautions given. Pt and family state they understand and agree to plan.   Final Clinical Impressions(s) / ED Diagnoses   Final diagnoses:  Closed fracture dislocation of right elbow, initial  encounter    ED Discharge Orders        Ordered    naproxen (NAPROSYN) 500 MG tablet  2 times daily with meals     10/24/17 1509    HYDROcodone-acetaminophen (NORCO/VICODIN) 5-325 MG tablet  Every 6 hours PRN     10/24/17 1509       Shon Mansouri, PA-C 10/24/17 1509    Raeford RazorKohut, Stephen, MD 10/24/17 1515

## 2017-10-25 ENCOUNTER — Encounter (HOSPITAL_COMMUNITY): Payer: Self-pay | Admitting: *Deleted

## 2017-10-25 ENCOUNTER — Other Ambulatory Visit: Payer: Self-pay

## 2017-10-25 DIAGNOSIS — S42401A Unspecified fracture of lower end of right humerus, initial encounter for closed fracture: Secondary | ICD-10-CM | POA: Diagnosis not present

## 2017-10-25 NOTE — Progress Notes (Signed)
SDW-pre-op call completed by pt mother, Darel HongJudy with pt verbal consent. Mother denies that pt C/O SOB and chest pain. Mother denies that pt is under the care of a cardiologist. Mother denies that pt had a stress test, echo and cardiac cath. Mother denies that pt had an EKG within the last year. Mother to bring recent labs. Mother made aware to have pt stop taking Aspirin, (unless advised otherwise by surgeon), vitamins, fish oil and herbal medications. Do not take any NSAIDs ie: Ibuprofen, Advil, Naproxen (Aleve), Motrin, BC and Goody Powder. Mother verbalized understanding of all pre-op instructions.

## 2017-10-26 ENCOUNTER — Ambulatory Visit (HOSPITAL_COMMUNITY)
Admission: RE | Admit: 2017-10-26 | Discharge: 2017-10-26 | Disposition: A | Discharge status: Home / Self Care | Payer: Medicare HMO | Source: Ambulatory Visit | Attending: Orthopedic Surgery | Admitting: Orthopedic Surgery

## 2017-10-26 ENCOUNTER — Encounter (HOSPITAL_COMMUNITY)
Admission: RE | Disposition: A | Discharge status: Home / Self Care | Payer: Self-pay | Source: Ambulatory Visit | Attending: Orthopedic Surgery

## 2017-10-26 ENCOUNTER — Ambulatory Visit (HOSPITAL_COMMUNITY): Payer: Medicare HMO | Admitting: Anesthesiology

## 2017-10-26 ENCOUNTER — Ambulatory Visit (HOSPITAL_COMMUNITY): Payer: Medicare HMO

## 2017-10-26 ENCOUNTER — Encounter (HOSPITAL_COMMUNITY): Payer: Self-pay | Admitting: Urology

## 2017-10-26 DIAGNOSIS — Z79899 Other long term (current) drug therapy: Secondary | ICD-10-CM | POA: Insufficient documentation

## 2017-10-26 DIAGNOSIS — F209 Schizophrenia, unspecified: Secondary | ICD-10-CM | POA: Diagnosis not present

## 2017-10-26 DIAGNOSIS — W548XXA Other contact with dog, initial encounter: Secondary | ICD-10-CM | POA: Insufficient documentation

## 2017-10-26 DIAGNOSIS — S52691A Other fracture of lower end of right ulna, initial encounter for closed fracture: Secondary | ICD-10-CM | POA: Diagnosis not present

## 2017-10-26 DIAGNOSIS — S42471A Displaced transcondylar fracture of right humerus, initial encounter for closed fracture: Secondary | ICD-10-CM | POA: Diagnosis not present

## 2017-10-26 DIAGNOSIS — G5691 Unspecified mononeuropathy of right upper limb: Secondary | ICD-10-CM | POA: Diagnosis not present

## 2017-10-26 DIAGNOSIS — R69 Illness, unspecified: Secondary | ICD-10-CM | POA: Diagnosis not present

## 2017-10-26 DIAGNOSIS — T148XXA Other injury of unspecified body region, initial encounter: Secondary | ICD-10-CM

## 2017-10-26 DIAGNOSIS — S42494A Other nondisplaced fracture of lower end of right humerus, initial encounter for closed fracture: Secondary | ICD-10-CM | POA: Diagnosis not present

## 2017-10-26 DIAGNOSIS — G8918 Other acute postprocedural pain: Secondary | ICD-10-CM | POA: Diagnosis not present

## 2017-10-26 DIAGNOSIS — Z419 Encounter for procedure for purposes other than remedying health state, unspecified: Secondary | ICD-10-CM

## 2017-10-26 HISTORY — DX: Schizophrenia, unspecified: F20.9

## 2017-10-26 HISTORY — DX: Adverse effect of unspecified anesthetic, initial encounter: T41.45XA

## 2017-10-26 HISTORY — DX: Pure hyperglyceridemia: E78.1

## 2017-10-26 HISTORY — DX: Gastro-esophageal reflux disease without esophagitis: K21.9

## 2017-10-26 HISTORY — DX: Other complications of anesthesia, initial encounter: T88.59XA

## 2017-10-26 HISTORY — DX: Other injury of unspecified body region, initial encounter: T14.8XXA

## 2017-10-26 HISTORY — PX: ORIF ELBOW FRACTURE: SHX5031

## 2017-10-26 LAB — URINALYSIS, ROUTINE W REFLEX MICROSCOPIC
Bilirubin Urine: NEGATIVE
Glucose, UA: NEGATIVE mg/dL
Ketones, ur: NEGATIVE mg/dL
Leukocytes, UA: NEGATIVE
NITRITE: NEGATIVE
PROTEIN: NEGATIVE mg/dL
Specific Gravity, Urine: 1.015 (ref 1.005–1.030)
pH: 7 (ref 5.0–8.0)

## 2017-10-26 LAB — COMPREHENSIVE METABOLIC PANEL
ALT: 14 U/L (ref 0–44)
AST: 33 U/L (ref 15–41)
Albumin: 4.5 g/dL (ref 3.5–5.0)
Alkaline Phosphatase: 55 U/L (ref 38–126)
Anion gap: 9 (ref 5–15)
BUN: 6 mg/dL (ref 6–20)
CALCIUM: 9.4 mg/dL (ref 8.9–10.3)
CHLORIDE: 104 mmol/L (ref 98–111)
CO2: 27 mmol/L (ref 22–32)
CREATININE: 0.85 mg/dL (ref 0.61–1.24)
GFR calc non Af Amer: >60 mL/min (ref >60)
GLUCOSE: 112 mg/dL — AB (ref 70–99)
Potassium: 4.2 mmol/L (ref 3.5–5.1)
SODIUM: 140 mmol/L (ref 135–145)
Total Bilirubin: 1.8 mg/dL — ABNORMAL HIGH (ref 0.3–1.2)
Total Protein: 6.9 g/dL (ref 6.5–8.1)

## 2017-10-26 LAB — PREALBUMIN: Prealbumin: 24.2 mg/dL (ref 18–38)

## 2017-10-26 LAB — TYPE AND SCREEN
ABO/RH(D): O POS
Antibody Screen: NEGATIVE

## 2017-10-26 LAB — ABO/RH: ABO/RH(D): O POS

## 2017-10-26 LAB — PROTIME-INR
INR: 1.01
Prothrombin Time: 13.2 seconds (ref 11.4–15.2)

## 2017-10-26 LAB — APTT: APTT: 30 s (ref 24–36)

## 2017-10-26 LAB — PHOSPHORUS: Phosphorus: 2.8 mg/dL (ref 2.5–4.6)

## 2017-10-26 LAB — MAGNESIUM: Magnesium: 2 mg/dL (ref 1.7–2.4)

## 2017-10-26 SURGERY — OPEN REDUCTION INTERNAL FIXATION (ORIF) ELBOW/OLECRANON FRACTURE
Anesthesia: General | Site: Arm Upper | Laterality: Right

## 2017-10-26 MED ORDER — GABAPENTIN 300 MG PO CAPS
300.0000 mg | ORAL_CAPSULE | Freq: Once | ORAL | Status: AC
  Administered 2017-10-26: 300 mg via ORAL
  Filled 2017-10-26: qty 1

## 2017-10-26 MED ORDER — MIDAZOLAM HCL 2 MG/2ML IJ SOLN
INTRAMUSCULAR | Status: AC
  Administered 2017-10-26: 1.5 mg via INTRAVENOUS
  Filled 2017-10-26: qty 2

## 2017-10-26 MED ORDER — LACTATED RINGERS IV SOLN
INTRAVENOUS | Status: DC | PRN
  Administered 2017-10-26 (×3): via INTRAVENOUS

## 2017-10-26 MED ORDER — FENTANYL CITRATE (PF) 100 MCG/2ML IJ SOLN
75.0000 ug | Freq: Once | INTRAMUSCULAR | Status: AC
  Administered 2017-10-26: 75 ug via INTRAVENOUS

## 2017-10-26 MED ORDER — ACETAMINOPHEN 500 MG PO TABS
1000.0000 mg | ORAL_TABLET | Freq: Once | ORAL | Status: AC
  Administered 2017-10-26: 500 mg via ORAL
  Filled 2017-10-26: qty 2

## 2017-10-26 MED ORDER — BUPIVACAINE-EPINEPHRINE (PF) 0.5% -1:200000 IJ SOLN
INTRAMUSCULAR | Status: DC | PRN
  Administered 2017-10-26: 30 mL via PERINEURAL

## 2017-10-26 MED ORDER — OXYCODONE HCL 5 MG PO TABS: 5.0000 mg | ORAL_TABLET | Freq: Three times a day (TID) | ORAL | 0 refills | Status: AC | PRN

## 2017-10-26 MED ORDER — MIDAZOLAM HCL 2 MG/2ML IJ SOLN
INTRAMUSCULAR | Status: AC
  Filled 2017-10-26: qty 2

## 2017-10-26 MED ORDER — LACTATED RINGERS IV SOLN
INTRAVENOUS | Status: DC
  Administered 2017-10-26: 11:00:00 via INTRAVENOUS

## 2017-10-26 MED ORDER — METHOCARBAMOL 500 MG PO TABS: 500.0000 mg | ORAL_TABLET | Freq: Three times a day (TID) | ORAL | 1 refills | Status: DC | PRN

## 2017-10-26 MED ORDER — LIDOCAINE HCL (CARDIAC) PF 100 MG/5ML IV SOSY
PREFILLED_SYRINGE | INTRAVENOUS | Status: DC | PRN
  Administered 2017-10-26: 30 mg via INTRAVENOUS

## 2017-10-26 MED ORDER — MEPERIDINE HCL 50 MG/ML IJ SOLN
12.5000 mg | Freq: Once | INTRAMUSCULAR | Status: AC
  Administered 2017-10-26: 12.5 mg via INTRAVENOUS

## 2017-10-26 MED ORDER — FENTANYL CITRATE (PF) 100 MCG/2ML IJ SOLN: 25.0000 ug | INTRAMUSCULAR | Status: DC | PRN

## 2017-10-26 MED ORDER — PROPOFOL 10 MG/ML IV BOLUS
INTRAVENOUS | Status: DC | PRN
  Administered 2017-10-26: 180 mg via INTRAVENOUS

## 2017-10-26 MED ORDER — 0.9 % SODIUM CHLORIDE (POUR BTL) OPTIME
TOPICAL | Status: DC | PRN
  Administered 2017-10-26: 1000 mL

## 2017-10-26 MED ORDER — ONDANSETRON HCL 4 MG/2ML IJ SOLN
INTRAMUSCULAR | Status: DC | PRN
  Administered 2017-10-26: 4 mg via INTRAVENOUS

## 2017-10-26 MED ORDER — OXYCODONE HCL 5 MG PO TABS: 5.0000 mg | ORAL_TABLET | Freq: Once | ORAL | Status: DC | PRN

## 2017-10-26 MED ORDER — CEFAZOLIN SODIUM-DEXTROSE 2-4 GM/100ML-% IV SOLN
INTRAVENOUS | Status: AC
  Filled 2017-10-26: qty 100

## 2017-10-26 MED ORDER — ROCURONIUM BROMIDE 100 MG/10ML IV SOLN
INTRAVENOUS | Status: DC | PRN
  Administered 2017-10-26: 50 mg via INTRAVENOUS

## 2017-10-26 MED ORDER — PROPOFOL 10 MG/ML IV BOLUS
INTRAVENOUS | Status: AC
  Filled 2017-10-26: qty 40

## 2017-10-26 MED ORDER — CHLORHEXIDINE GLUCONATE 4 % EX LIQD: 60.0000 mL | Freq: Once | CUTANEOUS | Status: DC

## 2017-10-26 MED ORDER — FENTANYL CITRATE (PF) 100 MCG/2ML IJ SOLN
INTRAMUSCULAR | Status: AC
  Administered 2017-10-26: 75 ug via INTRAVENOUS
  Filled 2017-10-26: qty 2

## 2017-10-26 MED ORDER — FENTANYL CITRATE (PF) 100 MCG/2ML IJ SOLN
INTRAMUSCULAR | Status: DC | PRN
  Administered 2017-10-26: 50 ug via INTRAVENOUS
  Administered 2017-10-26 (×2): 100 ug via INTRAVENOUS

## 2017-10-26 MED ORDER — CEFAZOLIN SODIUM-DEXTROSE 2-4 GM/100ML-% IV SOLN
2.0000 g | INTRAVENOUS | Status: AC
  Administered 2017-10-26 (×2): 2 g via INTRAVENOUS

## 2017-10-26 MED ORDER — FENTANYL CITRATE (PF) 250 MCG/5ML IJ SOLN
INTRAMUSCULAR | Status: AC
  Filled 2017-10-26: qty 5

## 2017-10-26 MED ORDER — HYDROCODONE-ACETAMINOPHEN 5-325 MG PO TABS: 1.0000 | ORAL_TABLET | Freq: Four times a day (QID) | ORAL | 0 refills | Status: DC | PRN

## 2017-10-26 MED ORDER — MEPERIDINE HCL 50 MG/ML IJ SOLN
INTRAMUSCULAR | Status: AC
  Filled 2017-10-26: qty 1

## 2017-10-26 MED ORDER — SUGAMMADEX SODIUM 200 MG/2ML IV SOLN
INTRAVENOUS | Status: DC | PRN
  Administered 2017-10-26: 177 mg via INTRAVENOUS

## 2017-10-26 MED ORDER — OXYCODONE HCL 5 MG/5ML PO SOLN: 5.0000 mg | Freq: Once | ORAL | Status: DC | PRN

## 2017-10-26 MED ORDER — MIDAZOLAM HCL 2 MG/2ML IJ SOLN
1.5000 mg | Freq: Once | INTRAMUSCULAR | Status: AC
  Administered 2017-10-26: 1.5 mg via INTRAVENOUS

## 2017-10-26 SURGICAL SUPPLY — 116 items
APL SKNCLS STERI-STRIP NONHPOA (GAUZE/BANDAGES/DRESSINGS)
BANDAGE ACE 3X5.8 VEL STRL LF (GAUZE/BANDAGES/DRESSINGS) ×2 IMPLANT
BANDAGE ACE 4X5 VEL STRL LF (GAUZE/BANDAGES/DRESSINGS) ×2 IMPLANT
BANDAGE ACE 6X5 VEL STRL LF (GAUZE/BANDAGES/DRESSINGS) ×1 IMPLANT
BENZOIN TINCTURE PRP APPL 2/3 (GAUZE/BANDAGES/DRESSINGS) IMPLANT
BIT DRILL 2.0 HCS 150 (BIT) ×1 IMPLANT
BIT DRILL 2.0 LNG QUCK RELEASE (BIT) IMPLANT
BIT DRILL 2.5 X LONG (BIT) ×1
BIT DRILL 2.8 QUICK RELEASE (BIT) IMPLANT
BIT DRILL LCP QC 2X140 (BIT) ×1 IMPLANT
BIT DRILL X LONG 2.5 (BIT) IMPLANT
BLADE AVERAGE 25X9 (BLADE) ×2 IMPLANT
BLADE CLIPPER SURG (BLADE) ×1 IMPLANT
BLADE SURG 10 STRL SS (BLADE) IMPLANT
BNDG CMPR 9X4 STRL LF SNTH (GAUZE/BANDAGES/DRESSINGS)
BNDG COHESIVE 4X5 TAN STRL (GAUZE/BANDAGES/DRESSINGS) ×1 IMPLANT
BNDG ESMARK 4X9 LF (GAUZE/BANDAGES/DRESSINGS) ×1 IMPLANT
BNDG GAUZE ELAST 4 BULKY (GAUZE/BANDAGES/DRESSINGS) ×2 IMPLANT
BRUSH SCRUB SURG 4.25 DISP (MISCELLANEOUS) ×4 IMPLANT
CANISTER SUCTION WELLS/JOHNSON (MISCELLANEOUS) ×1 IMPLANT
CLEANER TIP ELECTROSURG 2X2 (MISCELLANEOUS) ×1 IMPLANT
CORD BIPOLAR FORCEPS 12FT (ELECTRODE) ×1 IMPLANT
COVER SURGICAL LIGHT HANDLE (MISCELLANEOUS) ×3 IMPLANT
CUFF TOURNIQUET SINGLE 18IN (TOURNIQUET CUFF) IMPLANT
CUFF TOURNIQUET SINGLE 24IN (TOURNIQUET CUFF) IMPLANT
DECANTER SPIKE VIAL GLASS SM (MISCELLANEOUS) IMPLANT
DRAIN PENROSE 1/4X12 LTX STRL (WOUND CARE) ×1 IMPLANT
DRAPE C-ARM 42X72 X-RAY (DRAPES) ×1 IMPLANT
DRAPE C-ARMOR (DRAPES) ×1 IMPLANT
DRAPE INCISE IOBAN 66X45 STRL (DRAPES) IMPLANT
DRAPE U-SHAPE 47X51 STRL (DRAPES) ×1 IMPLANT
DRILL 2.0 LNG QUICK RELEASE (BIT) ×2
DRILL 2.8 QUICK RELEASE (BIT) ×2
DRILL BIT X LONG 2.5 (BIT) ×2
DRSG ADAPTIC 3X8 NADH LF (GAUZE/BANDAGES/DRESSINGS) ×1 IMPLANT
DRSG EMULSION OIL 3X3 NADH (GAUZE/BANDAGES/DRESSINGS) IMPLANT
ELECT REM PT RETURN 9FT ADLT (ELECTROSURGICAL) ×2
ELECTRODE REM PT RTRN 9FT ADLT (ELECTROSURGICAL) ×1 IMPLANT
FACESHIELD WRAPAROUND (MASK) IMPLANT
FACESHIELD WRAPAROUND OR TEAM (MASK) IMPLANT
GAUZE SPONGE 4X4 12PLY STRL (GAUZE/BANDAGES/DRESSINGS) ×2 IMPLANT
GAUZE XEROFORM 1X8 LF (GAUZE/BANDAGES/DRESSINGS) ×1 IMPLANT
GAUZE XEROFORM 5X9 LF (GAUZE/BANDAGES/DRESSINGS) ×1 IMPLANT
GLOVE BIO SURGEON STRL SZ7.5 (GLOVE) ×3 IMPLANT
GLOVE BIO SURGEON STRL SZ8 (GLOVE) ×3 IMPLANT
GLOVE BIOGEL PI IND STRL 7.5 (GLOVE) ×1 IMPLANT
GLOVE BIOGEL PI IND STRL 8 (GLOVE) ×1 IMPLANT
GLOVE BIOGEL PI INDICATOR 7.5 (GLOVE) ×1
GLOVE BIOGEL PI INDICATOR 8 (GLOVE) ×1
GOWN STRL REUS W/ TWL LRG LVL3 (GOWN DISPOSABLE) ×2 IMPLANT
GOWN STRL REUS W/ TWL XL LVL3 (GOWN DISPOSABLE) ×1 IMPLANT
GOWN STRL REUS W/TWL LRG LVL3 (GOWN DISPOSABLE) ×6
GOWN STRL REUS W/TWL XL LVL3 (GOWN DISPOSABLE) ×2
GUIDEWARE NON THREAD 1.25X150 (WIRE) ×2
GUIDEWIRE NON THREAD 1.25X150 (WIRE) IMPLANT
GUIDEWIRE ORTHO 2.0X9 ST (WIRE) ×1 IMPLANT
K-WIRE NON THREAD 1.1 (WIRE) ×4
KIT BASIN OR (CUSTOM PROCEDURE TRAY) ×2 IMPLANT
KIT INFUSE LRG II (Orthopedic Implant) ×1 IMPLANT
KIT TURNOVER KIT B (KITS) ×2 IMPLANT
KWIRE NON THREAD 1.1 (WIRE) IMPLANT
MANIFOLD NEPTUNE II (INSTRUMENTS) ×1 IMPLANT
NS IRRIG 1000ML POUR BTL (IV SOLUTION) ×2 IMPLANT
PACK ORTHO EXTREMITY (CUSTOM PROCEDURE TRAY) ×2 IMPLANT
PAD ARMBOARD 7.5X6 YLW CONV (MISCELLANEOUS) ×4 IMPLANT
PAD CAST 3X4 CTTN HI CHSV (CAST SUPPLIES) IMPLANT
PAD CAST 4YDX4 CTTN HI CHSV (CAST SUPPLIES) ×1 IMPLANT
PADDING CAST COTTON 3X4 STRL (CAST SUPPLIES) ×2
PADDING CAST COTTON 4X4 STRL (CAST SUPPLIES) ×2
PLATE HUMERUS 4H RT 88 2.7/3.5 (Plate) ×1 IMPLANT
PLATE HUMERUS MED DISTAL (Plate) ×1 IMPLANT
PLATE OLECRANON RT .3HOLE (Plate) ×1 IMPLANT
SCREW COMPRESSION 2.4X22MM (Screw) ×2 IMPLANT
SCREW CORT HEADED ST 3.5X24 (Screw) ×2 IMPLANT
SCREW CORT HEADED ST 3.5X26 (Screw) ×1 IMPLANT
SCREW CORT LP ST 3.5X22 (Screw) ×2 IMPLANT
SCREW LOCK 18X2.7X HEXALOBE (Screw) IMPLANT
SCREW LOCK VA ST 2.7X26 (Screw) ×1 IMPLANT
SCREW LOCK VA ST 2.7X32 (Screw) ×1 IMPLANT
SCREW LOCKING 2.7X18MM (Screw) ×2 IMPLANT
SCREW LOCKING 2.7X28 (Screw) ×1 IMPLANT
SCREW LOCKING 2.7X60MM (Screw) ×3 IMPLANT
SCREW LOCKING 3.5X45MM (Screw) ×1 IMPLANT
SCREW LOCKING VA 2.7X30MM (Screw) ×1 IMPLANT
SCREW LOCKING VA 2.7X50MM (Screw) ×1 IMPLANT
SCREW METAPHYSEAL 2.7X60MM (Screw) ×1 IMPLANT
SCREW METAPYSEAL 2.7X70MM (Screw) ×1 IMPLANT
SCREW NON LOCK 2.7X16 (Screw) ×1 IMPLANT
SCREW NON LOCKING HEX 3.5X18MM (Screw) ×1 IMPLANT
SCREW NON LOCKING HEX 3.5X24 (Screw) ×1 IMPLANT
SLING ARM FOAM STRAP LRG (SOFTGOODS) ×1 IMPLANT
SPONGE LAP 18X18 X RAY DECT (DISPOSABLE) ×5 IMPLANT
STAPLER VISISTAT 35W (STAPLE) ×2 IMPLANT
STRIP CLOSURE SKIN 1/2X4 (GAUZE/BANDAGES/DRESSINGS) IMPLANT
SUCTION FRAZIER HANDLE 10FR (MISCELLANEOUS) ×1
SUCTION TUBE FRAZIER 10FR DISP (MISCELLANEOUS) ×1 IMPLANT
SUT ETHILON 2 0 FS 18 (SUTURE) ×3 IMPLANT
SUT PDS AB 1 CT  36 (SUTURE) ×1
SUT PDS AB 1 CT 36 (SUTURE) IMPLANT
SUT PDS AB 2-0 CT1 27 (SUTURE) IMPLANT
SUT PROLENE 1 CT (SUTURE) ×1 IMPLANT
SUT PROLENE 2 0 CT 1 (SUTURE) ×1 IMPLANT
SUT PROLENE 3 0 PS 2 (SUTURE) ×2 IMPLANT
SUT VIC AB 0 CT1 27 (SUTURE) ×2
SUT VIC AB 0 CT1 27XBRD ANBCTR (SUTURE) ×2 IMPLANT
SUT VIC AB 1 CT1 27 (SUTURE) ×2
SUT VIC AB 1 CT1 27XBRD ANBCTR (SUTURE) IMPLANT
SUT VIC AB 2-0 CT1 27 (SUTURE) ×4
SUT VIC AB 2-0 CT1 TAPERPNT 27 (SUTURE) ×2 IMPLANT
SUT VIC AB 2-0 CT3 27 (SUTURE) IMPLANT
SYR CONTROL 10ML LL (SYRINGE) ×1 IMPLANT
TOWEL OR 17X24 6PK STRL BLUE (TOWEL DISPOSABLE) IMPLANT
TOWEL OR 17X26 10 PK STRL BLUE (TOWEL DISPOSABLE) ×4 IMPLANT
TUBE CONNECTING 12X1/4 (SUCTIONS) ×2 IMPLANT
UNDERPAD 30X30 (UNDERPADS AND DIAPERS) ×3 IMPLANT
YANKAUER SUCT BULB TIP NO VENT (SUCTIONS) ×2 IMPLANT

## 2017-10-26 NOTE — Anesthesia Procedure Notes (Addendum)
Anesthesia Regional Block: Supraclavicular block   Pre-Anesthetic Checklist: ,, timeout performed, Correct Patient, Correct Site, Correct Laterality, Correct Procedure, Correct Position, site marked, Risks and benefits discussed,  Surgical consent,  Pre-op evaluation,  At surgeon's request and post-op pain management  Laterality: Left and Upper  Prep: chloraprep, alcohol swabs       Needles:   Needle Type: Echogenic Stimulator Needle     Needle Length: 9cm  Needle Gauge: 21   Needle insertion depth: 6 cm   Additional Needles:   Procedures:,,,, ultrasound used (permanent image in chart),,,,  Narrative:  Start time: 10/26/2017 11:35 AM End time: 10/26/2017 11:53 AM Injection made incrementally with aspirations every 5 mL.  Performed by: Personally  Anesthesiologist: Sharee HolsterMassagee, Rosabelle Jupin, MD

## 2017-10-26 NOTE — Anesthesia Postprocedure Evaluation (Signed)
Anesthesia Post Note  Patient: Andrew Conley  Procedure(s) Performed: OPEN REDUCTION INTERNAL FIXATION DISTAL HUMERUS (Right Arm Upper)     Patient location during evaluation: PACU Anesthesia Type: General and Regional Level of consciousness: awake and alert Pain management: pain level controlled Vital Signs Assessment: post-procedure vital signs reviewed and stable Respiratory status: spontaneous breathing, nonlabored ventilation, respiratory function stable and patient connected to nasal cannula oxygen Cardiovascular status: blood pressure returned to baseline and stable Postop Assessment: no apparent nausea or vomiting Anesthetic complications: no    Last Vitals:  Vitals:   10/26/17 1947 10/26/17 2007  BP: 118/72 119/70  Pulse: (!) 123 (!) 120  Resp: 16 15  Temp: 36.6 C 36.6 C  SpO2: 92% 93%    Last Pain:  Vitals:   10/26/17 1947  TempSrc:   PainSc: Asleep                 Alicyn Klann COKER

## 2017-10-26 NOTE — Anesthesia Procedure Notes (Signed)
Procedure Name: Intubation Date/Time: 10/26/2017 2:01 PM Performed by: Eligha Bridegroom, CRNA Pre-anesthesia Checklist: Patient identified, Emergency Drugs available, Suction available, Patient being monitored and Timeout performed Patient Re-evaluated:Patient Re-evaluated prior to induction Oxygen Delivery Method: Circle system utilized Preoxygenation: Pre-oxygenation with 100% oxygen Induction Type: IV induction Ventilation: Oral airway inserted - appropriate to patient size and Mask ventilation without difficulty Laryngoscope Size: Mac and 4 Grade View: Grade I Tube type: Oral Tube size: 7.5 mm Number of attempts: 1 Airway Equipment and Method: Stylet Placement Confirmation: ETT inserted through vocal cords under direct vision,  positive ETCO2 and breath sounds checked- equal and bilateral Secured at: 22 cm Tube secured with: Tape Dental Injury: Teeth and Oropharynx as per pre-operative assessment

## 2017-10-26 NOTE — Transfer of Care (Signed)
Immediate Anesthesia Transfer of Care Note  Patient: Andrew Conley  Procedure(s) Performed: OPEN REDUCTION INTERNAL FIXATION DISTAL HUMERUS (Right Arm Upper)  Patient Location: PACU  Anesthesia Type:General  Level of Consciousness: awake and alert   Airway & Oxygen Therapy: Patient Spontanous Breathing and Patient connected to nasal cannula oxygen  Post-op Assessment: Report given to RN and Post -op Vital signs reviewed and stable  Post vital signs: Reviewed and stable  Last Vitals:  Vitals Value Taken Time  BP 135/113 10/26/2017  7:22 PM  Temp 36.6 C 10/26/2017  7:18 PM  Pulse 132 10/26/2017  7:27 PM  Resp 22 10/26/2017  7:27 PM  SpO2 93 % 10/26/2017  7:27 PM  Vitals shown include unvalidated device data.  Last Pain:  Vitals:   10/26/17 1918  TempSrc:   PainSc: 0-No pain      Patients Stated Pain Goal: 4 (10/26/17 1021)  Complications: No apparent anesthesia complications

## 2017-10-26 NOTE — Discharge Instructions (Addendum)
[facility] Discharge Instructions   General Discharge Instructions  WEIGHT BEARING STATUS: Nonweightbearing Right arm   RANGE OF MOTION/ACTIVITY: ok to move fingers and wrist.  Sling on when up moving around. Ok to leave sling off when seated or sleeping. Ok to sleep in sling as well.   No not remove dressing or splint to R arm   Wound Care: do not remove dressing. Call office with concerns    Diet: as you were eating previously.  Can use over the counter stool softeners and bowel preparations, such as Miralax, to help with bowel movements.  Narcotics can be constipating.  Be sure to drink plenty of fluids  PAIN MEDICATION USE AND EXPECTATIONS  You have likely been given narcotic medications to help control your pain.  After a traumatic event that results in an fracture (broken bone) with or without surgery, it is ok to use narcotic pain medications to help control one's pain.  We understand that everyone responds to pain differently and each individual patient will be evaluated on a regular basis for the continued need for narcotic medications. Ideally, narcotic medication use should last no more than 6-8 weeks (coinciding with fracture healing).   As a patient it is your responsibility as well to monitor narcotic medication use and report the amount and frequency you use these medications when you come to your office visit.   We would also advise that if you are using narcotic medications, you should take a dose prior to therapy to maximize you participation.  IF YOU ARE ON NARCOTIC MEDICATIONS IT IS NOT PERMISSIBLE TO OPERATE A MOTOR VEHICLE (MOTORCYCLE/CAR/TRUCK/MOPED) OR HEAVY MACHINERY DO NOT MIX NARCOTICS WITH OTHER CNS (CENTRAL NERVOUS SYSTEM) DEPRESSANTS SUCH AS ALCOHOL   STOP SMOKING OR USING NICOTINE PRODUCTS!!!!  As discussed nicotine severely impairs your body's ability to heal surgical and traumatic wounds but also impairs bone healing.  Wounds and bone heal by  forming microscopic blood vessels (angiogenesis) and nicotine is a vasoconstrictor (essentially, shrinks blood vessels).  Therefore, if vasoconstriction occurs to these microscopic blood vessels they essentially disappear and are unable to deliver necessary nutrients to the healing tissue.  This is one modifiable factor that you can do to dramatically increase your chances of healing your injury.    (This means no smoking, no nicotine gum, patches, etc)  DO NOT USE NONSTEROIDAL ANTI-INFLAMMATORY DRUGS (NSAID'S)  Using products such as Advil (ibuprofen), Aleve (naproxen), Motrin (ibuprofen) for additional pain control during fracture healing can delay and/or prevent the healing response.  If you would like to take over the counter (OTC) medication, Tylenol (acetaminophen) is ok.  However, some narcotic medications that are given for pain control contain acetaminophen as well. Therefore, you should not exceed more than 4000 mg of tylenol in a day if you do not have liver disease.  Also note that there are may OTC medicines, such as cold medicines and allergy medicines that my contain tylenol as well.  If you have any questions about medications and/or interactions please ask your doctor/PA or your pharmacist.      ICE AND ELEVATE INJURED/OPERATIVE EXTREMITY  Using ice and elevating the injured extremity above your heart can help with swelling and pain control.  Icing in a pulsatile fashion, such as 20 minutes on and 20 minutes off, can be followed.    Do not place ice directly on skin. Make sure there is a barrier between to skin and the ice pack.    Using frozen items  such as frozen peas works well as the conform nicely to the are that needs to be iced.  USE AN ACE WRAP OR TED HOSE FOR SWELLING CONTROL  In addition to icing and elevation, Ace wraps or TED hose are used to help limit and resolve swelling.  It is recommended to use Ace wraps or TED hose until you are informed to stop.    When using Ace  Wraps start the wrapping distally (farthest away from the body) and wrap proximally (closer to the body)   Example: If you had surgery on your leg or thing and you do not have a splint on, start the ace wrap at the toes and work your way up to the thigh        If you had surgery on your upper extremity and do not have a splint on, start the ace wrap at your fingers and work your way up to the upper arm  IF YOU ARE IN A SPLINT OR CAST DO NOT REMOVE IT FOR ANY REASON   If your splint gets wet for any reason please contact the office immediately. You may shower in your splint or cast as long as you keep it dry.  This can be done by wrapping in a cast cover or garbage back (or similar)  Do Not stick any thing down your splint or cast such as pencils, money, or hangers to try and scratch yourself with.  If you feel itchy take benadryl as prescribed on the bottle for itching  IF YOU ARE IN A CAM BOOT (BLACK BOOT)  You may remove boot periodically. Perform daily dressing changes as noted below.  Wash the liner of the boot regularly and wear a sock when wearing the boot. It is recommended that you sleep in the boot until told otherwise  CALL THE OFFICE WITH ANY QUESTIONS OR CONCERNS: [phone]

## 2017-10-26 NOTE — H&P (Signed)
[facility]th[facility]uma [facility] H&P/Consult     Patient ID: SRIRAM FEBLES MRN: [id number] DOB/AGE: 38-Sep-1982 38 y.o.  Chief Complaint: right transcondylar distal humerus fracture HPI: Andrew Conley is an 37 y.o. male.RHD pulled down during a dog chase. No other injuries. No paresthesias. Given the location and complexity of this distal humerus fracture, the referring surgeon, Dr. Lorin Mercy, asserted this was outside his scope of practice and that it would be in the best interest of the patient to have these injuries evaluated and treated by a fellowship trained orthopaedic traumatologist. Consequently, I was consulted to provide further evaluation and management.  Past Medical History:  Diagnosis Date  . Complication of anesthesia    trouble waking up with back surgery  . Fracture    right elbow  . GERD (gastroesophageal reflux disease)   . High triglycerides   . Mentally disabled   . Previous back surgery 1999  . Schizophrenia ([place])   . Scoliosis     Past Surgical History:  Procedure Laterality Date  . BACK SURGERY    . EYE SURGERY     37 year old  . TONSILLECTOMY AND ADENOIDECTOMY    . WISDOM TOOTH EXTRACTION      Family History  Problem Relation Age of Onset  . Hypertension Mother   . Breast cancer Mother    Social History:  reports that he has never smoked. He has never used smokeless tobacco. He reports that he drank alcohol. He reports that he does not use drugs.  Allergies:  Allergies  Allergen Reactions  . Morphine And Related Other (See Comments)    Monitor respirations when using; had problems breathing with back surgery in 1997 with medication    Medications Prior to Admission  Medication Sig Dispense Refill  . acetaminophen-codeine (TYLENOL #3) 300-30 MG tablet Take 1 tablet by mouth every 8 (eight) hours as needed for moderate pain.    . clonazePAM (KLONOPIN) 0.5 MG tablet Take 0.5 mg by mouth at bedtime.    . niacin (NIASPAN) 500 MG CR tablet Take 500 mg by  mouth at bedtime.   5  . risperiDONE (RISPERDAL) 3 MG tablet Take 3 mg by mouth at bedtime.   1  . HYDROcodone-acetaminophen (NORCO/VICODIN) 5-325 MG tablet Take 1 tablet by mouth every 6 (six) hours as needed for severe pain. 8 tablet 0  . naproxen (NAPROSYN) 500 MG tablet Take 1 tablet (500 mg total) by mouth 2 (two) times daily with a meal. 30 tablet 0    Results for orders placed or performed during the hospital encounter of 10/26/17 (from the past 48 hour(s))  Comprehensive metabolic panel     Status: Abnormal   Collection Time: 10/26/17 10:23 AM  Result Value Ref Range   Sodium 140 135 - 145 mmol/L   Potassium 4.2 3.5 - 5.1 mmol/L   Chloride 104 98 - 111 mmol/L    Comment: Please note change in reference range.   CO2 27 22 - 32 mmol/L   Glucose, Bld 112 (H) 70 - 99 mg/dL    Comment: Please note change in reference range.   BUN 6 6 - 20 mg/dL    Comment: Please note change in reference range.   Creatinine, Ser 0.85 0.61 - 1.24 mg/dL   Calcium 9.4 8.9 - 10.3 mg/dL   Total Protein 6.9 6.5 - 8.1 g/dL   Albumin 4.5 3.5 - 5.0 g/dL   AST 33 15 - 41 U/L   ALT 14 0 - 44  U/L    Comment: Please note change in reference range.   Alkaline Phosphatase 55 38 - 126 U/L   Total Bilirubin 1.8 (H) 0.3 - 1.2 mg/dL   GFR calc non Af Amer >60 >60 mL/min   GFR calc Af Amer >60 >60 mL/min    Comment: (NOTE) The eGFR has been calculated using the CKD EPI equation. This calculation has not been validated in all clinical situations. eGFR's persistently <60 mL/min signify possible Chronic Kidney Disease.    Anion gap 9 5 - 15    Comment: Performed at [facility] [address] [place], [place] [address]  Prealbumin     Status: None   Collection Time: 10/26/17 10:23 AM  Result Value Ref Range   Prealbumin 24.2 18 - 38 mg/dL    Comment: Performed at [facility] [address] [place], [place] [address]  Magnesium     Status: None   Collection Time: 10/26/17 10:23 AM  Result  Value Ref Range   Magnesium 2.0 1.7 - 2.4 mg/dL    Comment: Performed at [facility] [address], [place], [place] [address]  Phosphorus     Status: None   Collection Time: 10/26/17 10:23 AM  Result Value Ref Range   Phosphorus 2.8 2.5 - 4.6 mg/dL    Comment: Performed at [facility] [address] [place], [place] [address]  Protime-INR     Status: None   Collection Time: 10/26/17 10:23 AM  Result Value Ref Range   Prothrombin Time 13.2 11.4 - 15.2 seconds    Comment: REPEATED TO VERIFY   INR 1.01     Comment: Performed at [facility] [address] [place], [place] [address]  APTT     Status: None   Collection Time: 10/26/17 10:23 AM  Result Value Ref Range   aPTT 30 24 - 36 seconds    Comment: REPEATED TO VERIFY Performed at [facility], [place] [address], [place], [place] [address] CORRECTED ON 07/11 AT 1155: PREVIOUSLY REPORTED AS 31   Type and screen Order type and screen if day of surgery is less than 15 days from draw of preadmission visit or order morning of surgery if day of surgery is greater than 6 days from preadmission visit.     Status: None   Collection Time: 10/26/17 10:30 AM  Result Value Ref Range   ABO/RH(D) O POS    Antibody Screen NEG    Sample Expiration      10/29/2017 Performed at [facility], [place] [address], [place], [place] [address]   Urinalysis, Routine w reflex microscopic     Status: Abnormal   Collection Time: 10/26/17 11:22 AM  Result Value Ref Range   Color, Urine YELLOW YELLOW   APPearance CLEAR CLEAR   Specific Gravity, Urine 1.015 1.005 - 1.030   pH 7.0 5.0 - 8.0   Glucose, UA NEGATIVE NEGATIVE mg/dL   Hgb urine dipstick SMALL (A) NEGATIVE   Bilirubin Urine NEGATIVE NEGATIVE   Ketones, ur NEGATIVE NEGATIVE mg/dL   Protein, ur NEGATIVE NEGATIVE mg/dL   Nitrite NEGATIVE NEGATIVE   Leukocytes, UA NEGATIVE NEGATIVE   RBC / HPF 0-5 0 - 5 RBC/hpf   WBC, UA 0-5 0 - 5 WBC/hpf   Bacteria, UA RARE (A)  NONE SEEN   Squamous Epithelial / LPF 0-5 0 - 5   Mucus PRESENT     Comment: Performed at  [facility], [place] [address], [place], [place] [address]   Dg Chest 2 View  Result Date: 10/24/2017 CLINICAL DATA:  No chest pain or shortness of breath EXAM: CHEST - 2 VIEW COMPARISON:  None. FINDINGS: There is no focal parenchymal opacity. There is no pleural effusion or pneumothorax. The heart and mediastinal contours are unremarkable. There is posterior spinal fusion hardware noted transfixing the thoracic spine. IMPRESSION: No active cardiopulmonary disease. Electronically Signed   By: Kathreen Devoid   On: 10/24/2017 14:05   Ct Elbow Right Wo Contrast  Result Date: 10/24/2017 CLINICAL DATA:  Acute elbow fracture. EXAM: CT OF THE LOWER RIGHT EXTREMITY WITHOUT CONTRAST TECHNIQUE: Multidetector CT imaging of the right lower extremity was performed according to the standard protocol. COMPARISON:  None. FINDINGS: Bones/Joint/Cartilage There is an acute, closed, transcondylar fracture of the right distal humerus with comminution involving the capitellum and volar aspect of the condyles. There is proximal displacement of a 1.1 x 3.7 x 2.5 cm ventral condylar fracture fragment, series 4/57 and series 5/69 by up to 1.3 cm. Additionally there is a sagittal fracture traversing the capitellum entering the elbow joint with approximately 7 mm of cephalad displacement of the lateral humeral condyle and epicondyle. The radial head and neck appear intact. The proximal ulna appears intact. Ligaments Suboptimally assessed by CT. Muscles and Tendons No intramuscular hemorrhage or atrophy. Intact distal triceps and biceps tendons. Soft tissues Soft tissue swelling along the ulnar aspect of the elbow. IMPRESSION: 1. Acute, comminuted T-shaped fracture of the distal right humerus with transcondylar and sagittal components to the fracture with sagittal fracture undermining the lateral humeral condyle and capitellum entering the elbow  joint at the radiocarpal joint. 2. Additionally there is a 1.1 x 3.7 x 2.5 cm displaced fracture fragment off the ventral aspect of the humeral condyles with proximal displacement up to 1.3 cm. 3. Positive elbow joint effusion. Electronically Signed   By: Ashley Royalty M.D.   On: 10/24/2017 15:10    ROS No recent fever, bleeding abnormalities, urologic dysfunction, GI problems, or weight gain.  Blood pressure 125/70, pulse 92, temperature 98.3 F (36.8 C), temperature source Oral, resp. rate 13, SpO2 99 %. Physical Exam NCAT, pleasant and appropriately interactive RRR CTA S/NT/ND RUEx  Elbow tender; splint in place  Shoulder,wrist, digits- no skin wounds, nontender, no instability, no blocks to motion  Sens  Ax/R/M/U intact  Mot   Ax/ R/ PIN/ M/ AIN/ U intact  Brisk CR   Assessment/Plan  Right transcondylar distal humerus fracture involving both columns and trochlear comminution  I discussed with the patient and his mother the risks and benefits of surgery, including the possibility of infection, nerve injury, vessel injury, wound breakdown, arthritis, symptomatic hardware, DVT/ PE, loss of motion, malunion, nonunion, and need for further surgery among others.  We also specifically discussed the elevated risk of symptomatic hardware at the olecranon and contracture with these injuries. They acknowledged these risks and wished to proceed.    Altamese Walton, MD [facility], [facility] [phone]  10/26/2017, 12:58 PM

## 2017-10-26 NOTE — Op Note (Signed)
10/26/2017  6:58 PM  PATIENT:  Andrew Conley  37 y.o. male  PRE-OPERATIVE DIAGNOSIS:  Right transcondylar humerus fracture with trochlear comminution  POST-OPERATIVE DIAGNOSIS:  Right transcondylar humerus fracture with trochlear comminution  PROCEDURE:  Procedure(s): 1. OPEN REDUCTION INTERNAL FIXATION OF TRANSCONDYLAR DISTAL HUMERUS (Right)  2. ULNAR NEUROLYSIS 3. OSTEOTOMY OF ULNA  SURGEON:  Surgeon(s) and Role:    * Myrene Galas, MD - Primary  PHYSICIAN ASSISTANT: Montez Morita, PA-C  ANESTHESIA:   general  EBL:  180 mL   BLOOD ADMINISTERED:none  DRAINS: none   LOCAL MEDICATIONS USED:  NONE  SPECIMEN:  No Specimen  DISPOSITION OF SPECIMEN:  N/A  COUNTS:  YES  TOURNIQUET:  * No tourniquets in log *  DICTATION: .Other Dictation: Dictation Number [id number]  PLAN OF CARE: Discharge to home after PACU  PATIENT DISPOSITION:  PACU - hemodynamically stable.   Delay start of Pharmacological VTE agent (>24hrs) due to surgical blood loss or risk of bleeding: no  BRIEF INDICATIONS:  The patient is a very pleasant 37 y.o. right hand dominant male on long term meds for schozophrenia that are associated with diminished bone density.  The patient sustained a fall resulting in a distal humerus fracture with trochlear involvement and displacement.  I discussed with the patient the risks and benefits of surgical repair, including the possibility of infection, nerve injury, vessel injury, loss of motion, which was a certainty.  Also arthritis, malunion, nonunion, need for further surgery and systemic complications which could include renal failure, heart attack or stroke.  After full discussion of these and others, the patient and his mother did wish to proceed.   BRIEF SUMMARY OF PROCEDURE:  The patient received a regional block and then was taken to the OR where general anesthesia was induced.  The right upper extremity was prepped and draped in the usual sterile fashion using  chlorhexidine scrub and wash followed by Betadine scrub and paint.  A posterior incision was then made to the elbow.  I initially went on the medial and lateral aspects of the triceps in order to gain access into the joint.  My assistant helped to protect and retract the ulnar nerve.  My hope was to perform a limited dissection, which would not increase the risk of HO and further ulnar nerve compromise and avoid the need for ulnar  osteotomy.  I was able to manipulate the medial column but the comminuted transcondylar segment and and capitellum clearly required an ulnar osteotomy, particularly with bone missing from other areas along distal lateral column.    Consequently, an ulnar osteotomy was necessary in order to accurately reduce the trochlear spool.  I marked out the borders for a [employer] osteotomy using the microsagittal saw and then an osteotome to complete the osteotomy.  This was reflected and revealed substantial articular injury to the trochlear cartilage as well as comminution and some bone  loss.  I then proceeded with K-wire fixation initially and then headless compression screw fixation to restore integrity to the trochlear spool affixing this to the medial column.  I then reduced the lateral column and placed a posterolateral plate with  fixation into the capitellum. The capitellum was a free coronal shear segment that was brought into a reduced position using two 0.045 k wires as joysticks. There remained a defect  within the lateral column and capitellum region in addition to rather severe comminution along the lateral border and epicondyle.  Part of this was repaired with a #1  PDS interwoven with the fragments and eventually placed under the lateral plate. The Infuse sponge was contained within the reconstruction.  Following this, I was able to take the elbow through range of motion with no movement of the fragments.    An extensile neurolysis was performed of the ulnar nerve extending the  incision proximally and distally tracing it into the muscle belly distal to the olecranon and mobilizing it fully.  Following a repair, I did a buried Prolene to reduce contact of the nerve  and plate and then placed the ulnar nerve back in the groove, but entirely released from soft tissue to reduce the chance of subsequent entrapment or worsening of her symptoms.  Wound was irrigated thoroughly and closed in standard layered  fashion with 0 #1 Vicryl for the triceps, 0 for the deep subcutaneous, 2-0 Vicryl and nylon for the skin.  Sterile gently compressive dressing and splint were applied.  Montez MoritaKeith Paul, PA-C, assisted me throughout as this was an extremely difficult case and he was required for maintaining provisional reduction during placement of the headless compression screws, osteotomy of the ulna, protection of the ulnar nerve and anterior neurovascular bundle throughout and assisted me with closure as well.   PROGNOSIS:  The patient will be in a posterior splint until return to the office at which time I would anticipate transition into a removable hinged brace or even a static splint that would allow the elbow to come in and out for a direct work with  physical therapy.  The bone quality is quite diminished and increases the risk of loss of fixation, and need for further surgery. Visible articular scraping and articular comminution with some bone loss also increases the chance of arthritis, but we were able to restore the appropriate width and integrity of the trochlear spool.  We anticipate keeping Andrew CornwallCurtis J Conley overnight given the extended procedure and we will continue to follow closely.

## 2017-10-27 ENCOUNTER — Encounter (HOSPITAL_COMMUNITY): Payer: Self-pay | Admitting: Orthopedic Surgery

## 2017-10-27 LAB — PTH, INTACT AND CALCIUM
Calcium, Total (PTH): 9.2 mg/dL (ref 8.7–10.2)
PTH: 35 pg/mL (ref 15–65)

## 2017-10-27 LAB — CALCIUM, IONIZED: CALCIUM, IONIZED, SERUM: 4.8 mg/dL (ref 4.5–5.6)

## 2017-10-27 LAB — VITAMIN D 25 HYDROXY (VIT D DEFICIENCY, FRACTURES): Vit D, 25-Hydroxy: 27.8 ng/mL — ABNORMAL LOW (ref 30.0–100.0)

## 2017-10-27 LAB — CALCITRIOL (1,25 DI-OH VIT D): Vit D, 1,25-Dihydroxy: 92.6 pg/mL — ABNORMAL HIGH (ref 19.9–79.3)

## 2017-11-06 NOTE — Anesthesia Preprocedure Evaluation (Signed)
Anesthesia Evaluation  Patient identified by MRN, date of birth, ID band Patient awake    Reviewed: Allergy & Precautions, NPO status , Patient's Chart, lab work & pertinent test results  Airway Mallampati: II   Neck ROM: Full    Dental  (+) Teeth Intact, Dental Advisory Given   Pulmonary    breath sounds clear to auscultation       Cardiovascular negative cardio ROS   Rhythm:Regular Rate:Normal     Neuro/Psych Schizophrenia    GI/Hepatic Neg liver ROS, GERD  ,  Endo/Other  negative endocrine ROS  Renal/GU negative Renal ROS     Musculoskeletal   Abdominal   Peds  Hematology negative hematology ROS (+)   Anesthesia Other Findings   Reproductive/Obstetrics                             Anesthesia Physical Anesthesia Plan  ASA: I  Anesthesia Plan:    Post-op Pain Management:  Regional for Post-op pain   Induction: Intravenous  PONV Risk Score and Plan: 2 and Treatment may vary due to age or medical condition  Airway Management Planned: Oral ETT  Additional Equipment:   Intra-op Plan:   Post-operative Plan: Extubation in OR  Informed Consent: I have reviewed the patients History and Physical, chart, labs and discussed the procedure including the risks, benefits and alternatives for the proposed anesthesia with the patient or authorized representative who has indicated his/her understanding and acceptance.   Dental advisory given  Plan Discussed with: CRNA  Anesthesia Plan Comments:         Anesthesia Quick Evaluation

## 2017-11-13 DIAGNOSIS — R69 Illness, unspecified: Secondary | ICD-10-CM | POA: Diagnosis not present

## 2017-11-15 DIAGNOSIS — S42401D Unspecified fracture of lower end of right humerus, subsequent encounter for fracture with routine healing: Secondary | ICD-10-CM | POA: Diagnosis not present

## 2017-11-22 ENCOUNTER — Ambulatory Visit: Payer: Medicare HMO | Attending: Orthopedic Surgery

## 2017-11-22 ENCOUNTER — Other Ambulatory Visit: Payer: Self-pay

## 2017-11-22 DIAGNOSIS — M25621 Stiffness of right elbow, not elsewhere classified: Secondary | ICD-10-CM | POA: Diagnosis not present

## 2017-11-22 DIAGNOSIS — R600 Localized edema: Secondary | ICD-10-CM | POA: Diagnosis not present

## 2017-11-22 DIAGNOSIS — M25521 Pain in right elbow: Secondary | ICD-10-CM | POA: Diagnosis not present

## 2017-11-22 NOTE — Patient Instructions (Signed)
Issued to pt and mother gentle wrist flex/ext and pron/supination  And elbow flexion/ extension with 20-30 sec 2-3 reps 2-3x/daysustained as tolerated with no significant incr pain.

## 2017-11-22 NOTE — Therapy (Signed)
[facility] [address] [place], [place], [address] Phone: [phone]   Fax:  [phone]  Physical Therapy Evaluation  Patient Details  Name: Andrew CornwallCurtis J Kreps MRN: [id number] Date of Birth: [birth year] Referring Provider: Inda MerlinMicheal Handy, MD   Encounter Date: 11/22/2017  PT End of Session - 11/22/17 1416    Visit Number  1    Number of Visits  24    Date for PT Re-Evaluation  02/09/18    Authorization Type  [employer] MCR    PT Start Time  0215    PT Stop Time  0300    PT Time Calculation (min)  45 min    Activity Tolerance  Patient tolerated treatment well;Patient limited by pain    Behavior During Therapy  [facility] for tasks assessed/performed       Past Medical History:  Diagnosis Date  . Complication of anesthesia    trouble waking up with back surgery  . Fracture    right elbow  . GERD (gastroesophageal reflux disease)   . High triglycerides   . Mentally disabled   . Previous back surgery 1999  . Schizophrenia ([facility])   . Scoliosis     Past Surgical History:  Procedure Laterality Date  . BACK SURGERY    . EYE SURGERY     37 year old  . ORIF ELBOW FRACTURE Right 10/26/2017   Procedure: OPEN REDUCTION INTERNAL FIXATION DISTAL HUMERUS;  Surgeon: Myrene GalasHandy, Michael, MD;  Location: [facility] OR;  Service: Orthopedics;  Laterality: Right;  . TONSILLECTOMY AND ADENOIDECTOMY    . WISDOM TOOTH EXTRACTION      There were no vitals filed for this visit.   Subjective Assessment - 11/22/17 1428    Subjective  He reports chasing dog with trip and fall on elbow. Now post ORIF     Patient is accompained by:  Family member    Limitations  Lifting;House hold activities    Patient Stated Goals  He wants to get arm back to normal if able    Currently in Pain?  Yes    Pain Score  1     Pain Location  Elbow    Pain Orientation  Right    Pain Descriptors / Indicators  Sharp    Pain Type  Surgical pain    Pain Onset  1 to 4 weeks ago    Pain  Frequency  Intermittent    Aggravating Factors   moving elbow and wrist    Pain Relieving Factors  rest         [facility] PT Assessment - 11/22/17 0001      Assessment   Medical Diagnosis  ORIF RT olecranon and distal humerous    Referring Provider  Inda MerlinMicheal Handy, MD    Onset Date/Surgical Date  10/26/17    Next MD Visit  next wed    Prior Therapy  No      Precautions   Precautions  None      Restrictions   Weight Bearing Restrictions  No      Balance Screen   Has the patient fallen in the past 6 months  Yes    How many times?  1 chasing dog tripped    Has the patient had a decrease in activity level because of a fear of falling?   No    Is the patient reluctant to leave their home because of a fear of falling?   No      Prior Function  Level of Independence  Needs assistance with homemaking    Vocation  On disability      Cognition   Overall Cognitive Status  Within Functional Limits for tasks assessed      Observation/Other Assessments-Edema    Edema  Circumferential      Circumferential Edema   Circumferential - Right  33 cm    Circumferential - Left   29 cm      ROM / Strength   AROM / PROM / Strength  AROM;PROM;Strength      AROM   AROM Assessment Site  Elbow;Forearm;Wrist    Right/Left Elbow  Right;Left    Right Elbow Flexion  90    Right Elbow Extension  -60    Left Elbow Flexion  137    Left Elbow Extension  0    Right/Left Forearm  Left;Right    Right Forearm Pronation  62 Degrees    Right Forearm Supination  60 Degrees    Left Forearm Pronation  86 Degrees    Left Forearm Supination  90 Degrees    Right/Left Wrist  Right    Right Wrist Extension  20 Degrees    Right Wrist Flexion  17 Degrees    Right Wrist Radial Deviation  40 Degrees    Right Wrist Ulnar Deviation  10 Degrees      PROM   PROM Assessment Site  Elbow    Right/Left Elbow  Right      Ambulation/Gait   Gait Comments  no limits                Objective measurements  completed on examination: See above findings.              PT Education - 11/22/17 1430    Education Details  POC, HEP    Person(s) Educated  Patient;Parent(s)    Methods  Explanation;Demonstration;Tactile cues;Verbal cues;Handout    Comprehension  Verbalized understanding;Returned demonstration       PT Short Term Goals - 11/22/17 1459      PT SHORT TERM GOAL #1   Title  He will have full active wrist ROM    Time  4    Period  Weeks    Status  New      PT SHORT TERM GOAL #2   Title  He will have active elbow flesion to 110 degrees    Time  4    Period  Weeks    Status  New      PT SHORT TERM GOAL #3   Title  He will have -30 degrees active RT elbow extension    Time  4    Period  Weeks    Status  New        PT Long Term Goals - 11/22/17 1500      PT LONG TERM GOAL #1   Title  He will be independent with all hEP issued    Time  12    Period  Weeks    Status  New      PT LONG TERM GOAL #2   Title  He will report able to return to cooking without help    Time  12    Period  Weeks    Status  New      PT LONG TERM GOAL #3   Title  he will have active RT elbow flexion to 125 degrees to allow normal liftingin kitchen    Time  12  Period  Weeks    Status  New      PT LONG TERM GOAL #4   Title  He will ahve active RT elbow extension  to allow for normal selfcare and cooking at home.     Time  12    Period  Weeks    Status  New      PT LONG TERM GOAL #5   Title  Hewill be able to lift 20 pounds from floor and carry 100 feet  without pain    Time  12    Period  Weeks    Status  New             Plan - 11/22/17 1417    Clinical Impression Statement  Mr Lowder is post ORIF RT elbow flexion.  He is now allowed to do Active and gentle passive motion. He has moderat to severe swelling an significnat elbow stiffenss. We will respect pain and progress as tolerated.  He may have some limitation with ROm but it is too early to know.     Clinical  Presentation  Stable    Clinical Decision Making  Low    Rehab Potential  Good    PT Frequency  2x / week    PT Duration  12 weeks    PT Treatment/Interventions  Cryotherapy;Therapeutic exercise;Therapeutic activities;Patient/family education;Manual techniques;Passive range of motion;Taping    PT Next Visit Plan  Gentl ROm and STW /edema control. REview HEP    PT Home Exercise Plan  Gentle elbow and wrist active and PROM     Consulted and Agree with Plan of Care  Patient;Family member/caregiver    Family Member Consulted  mother       Patient will benefit from skilled therapeutic intervention in order to improve the following deficits and impairments:  Pain, Impaired UE functional use, Decreased strength, Decreased range of motion, Decreased activity tolerance, Increased edema  Visit Diagnosis: Stiffness of right elbow joint  Pain in right elbow  Localized edema     Problem List Patient Active Problem List   Diagnosis Date Noted  . Schizophrenia, paranoid type (HCC) 06/19/2011    Class: Acute  . Mentally disabled 05/26/2011    Caprice Red  PT 11/22/2017, 3:03 PM  [facility] [address] [place], [place], [address] Phone: [phone]   Fax:  [phone]  Name: KHASIR WOODROME MRN: [id number] Date of Birth: 08-18-1980

## 2017-11-28 ENCOUNTER — Ambulatory Visit: Payer: Medicare HMO | Admitting: Physical Therapy

## 2017-11-28 DIAGNOSIS — M25521 Pain in right elbow: Secondary | ICD-10-CM

## 2017-11-28 DIAGNOSIS — M25621 Stiffness of right elbow, not elsewhere classified: Secondary | ICD-10-CM | POA: Diagnosis not present

## 2017-11-28 DIAGNOSIS — R600 Localized edema: Secondary | ICD-10-CM | POA: Diagnosis not present

## 2017-11-28 NOTE — Therapy (Signed)
[facility] [address] [place], [place], [address] Phone: [phone]   Fax:  [phone]  Physical Therapy Treatment  Patient Details  Name: Andrew Conley MRN: [id number] Date of Birth: 1981-03-25 Referring Provider: Inda MerlinMicheal Handy, MD   Encounter Date: 11/28/2017  PT End of Session - 11/28/17 1415    Visit Number  2    Number of Visits  24    Date for PT Re-Evaluation  02/09/18    Authorization Type  [employer] MCR    PT Start Time  1330    PT Stop Time  1420   last 10 min on ice   PT Time Calculation (min)  50 min    Activity Tolerance  Patient tolerated treatment well;Patient limited by pain    Behavior During Therapy  [facility] for tasks assessed/performed       Past Medical History:  Diagnosis Date  . Complication of anesthesia    trouble waking up with back surgery  . Fracture    right elbow  . GERD (gastroesophageal reflux disease)   . High triglycerides   . Mentally disabled   . Previous back surgery 1999  . Schizophrenia (HCC)   . Scoliosis     Past Surgical History:  Procedure Laterality Date  . BACK SURGERY    . EYE SURGERY     37 year old  . ORIF ELBOW FRACTURE Right 10/26/2017   Procedure: OPEN REDUCTION INTERNAL FIXATION DISTAL HUMERUS;  Surgeon: Myrene GalasHandy, Michael, MD;  Location[facility] OR;  Service: Orthopedics;  Laterality: Right;  . TONSILLECTOMY AND ADENOIDECTOMY    . WISDOM TOOTH EXTRACTION      There were no vitals filed for this visit.  Subjective Assessment - 11/28/17 1409    Subjective  Pt reports stiffness but mild pain in elbow    Patient is accompained by:  Family member   mom   Pain Score  2     Pain Location  Elbow    Pain Orientation  Right    Pain Descriptors / Indicators  Tightness    Pain Type  Surgical pain    Pain Onset  1 to 4 weeks ago         [facility] Assessment - 11/28/17 0001      AROM   Right Elbow Flexion  95    Right Elbow Extension  -55      PROM   PROM Assessment  Site  Elbow    Right/Left Elbow  Right    Right Elbow Flexion  100    Right Elbow Extension  -50                   [facility] Adult PT Treatment/Exercise - 11/28/17 0001      Exercises   Exercises  Elbow;Wrist      Elbow Exercises   Elbow Flexion  AROM;PROM   30   Elbow Extension  PROM;AROM   30   Forearm Supination  AROM   30 reps   Forearm Pronation  AROM   30 reps   Other elbow exercises  elbow flexion/extension stretch 30 sec X3 ea    Other elbow exercises  shoulder AROM flexion and abduction 2X10      Wrist Exercises   Wrist Flexion  AROM   30 reps   Wrist Extension  AROM   30   Other wrist exercises  gripping geen D-grip 30 reps      Modalities   Modalities  Cryotherapy      Cryotherapy   Number Minutes Cryotherapy  10 Minutes    Cryotherapy Location  --   Rt elbow   Type of Cryotherapy  Ice pack      Manual Therapy   Manual Therapy  Soft tissue mobilization;Passive ROM    Soft tissue mobilization  STM bicep    Passive ROM  PROM elbow flexion and extension to tolerance             PT Education - 11/28/17 1415    Education Details  HEP review, need for stretching and ice    Person(s) Educated  Patient;Parent(s)    Methods  Explanation;Demonstration;Verbal cues    Comprehension  Verbalized understanding;Need further instruction       PT Short Term Goals - 11/22/17 1459      PT SHORT TERM GOAL #1   Title  He will have full active wrist ROM    Time  4    Period  Weeks    Status  New      PT SHORT TERM GOAL #2   Title  He will have active elbow flesion to 110 degrees    Time  4    Period  Weeks    Status  New      PT SHORT TERM GOAL #3   Title  He will have -30 degrees active RT elbow extension    Time  4    Period  Weeks    Status  New        PT Long Term Goals - 11/22/17 1500      PT LONG TERM GOAL #1   Title  He will be independent with all hEP issued    Time  12    Period  Weeks    Status  New      PT LONG TERM  GOAL #2   Title  He will report able to return to cooking without help    Time  12    Period  Weeks    Status  New      PT LONG TERM GOAL #3   Title  he will have active RT elbow flexion to 125 degrees to allow normal liftingin kitchen    Time  12    Period  Weeks    Status  New      PT LONG TERM GOAL #4   Title  He will ahve active RT elbow extension  to allow for normal selfcare and cooking at home.     Time  12    Period  Weeks    Status  New      PT LONG TERM GOAL #5   Title  Hewill be able to lift 20 pounds from floor and carry 100 feet  without pain    Time  12    Period  Weeks    Status  New            Plan - 11/28/17 1416    Clinical Impression Statement  Pt able to progress his therex today without complaints. His biggest limitation is ROM and session focused on ROm with AROM/PROM STM, and stetching. PT will continue to progress as able.    Rehab Potential  Good    PT Frequency  2x / week    PT Duration  12 weeks    PT Treatment/Interventions  Cryotherapy;Therapeutic exercise;Therapeutic activities;Patient/family education;Manual techniques;Passive range of motion;Taping    PT Next Visit Plan  ROM    Consulted and Agree with Plan of Care  Patient;Family member/caregiver    Family Member Consulted  mother       Patient will benefit from skilled therapeutic intervention in order to improve the following deficits and impairments:  Pain, Impaired UE functional use, Decreased strength, Decreased range of motion, Decreased activity tolerance, Increased edema  Visit Diagnosis: Stiffness of right elbow joint  Pain in right elbow  Localized edema     Problem List Patient Active Problem List   Diagnosis Date Noted  . Schizophrenia, paranoid type (HCC) 06/19/2011    Class: Acute  . Mentally disabled 05/26/2011    [name], PT, DPT 11/28/2017, 2:29 PM  [facility] [address] [place], [place],  [address] Phone: [phone]   Fax:  [phone]  Name: Andrew Conley MRN: [id number] Date of Birth: December 05, 1980

## 2017-11-29 DIAGNOSIS — S42401D Unspecified fracture of lower end of right humerus, subsequent encounter for fracture with routine healing: Secondary | ICD-10-CM | POA: Diagnosis not present

## 2017-12-01 ENCOUNTER — Encounter

## 2017-12-05 ENCOUNTER — Ambulatory Visit: Payer: Medicare HMO

## 2017-12-05 DIAGNOSIS — R600 Localized edema: Secondary | ICD-10-CM

## 2017-12-05 DIAGNOSIS — M25521 Pain in right elbow: Secondary | ICD-10-CM

## 2017-12-05 DIAGNOSIS — M25621 Stiffness of right elbow, not elsewhere classified: Secondary | ICD-10-CM

## 2017-12-05 NOTE — Therapy (Signed)
[facility] [address] [place], [place], [address] Phone: [phone]   Fax:  [phone]  Physical Therapy Treatment  Patient Details  Name: Andrew CornwallCurtis J Burkman MRN: [id number] Date of Birth: [birth year] Referring Provider: Inda MerlinMicheal Handy, MD   Encounter Date: 12/05/2017  PT End of Session - 12/05/17 1422    Visit Number  3    Number of Visits  24    Date for PT Re-Evaluation  02/09/18    Authorization Type  Aetna [facility]    PT Start Time  0215    PT Stop Time  0306    PT Time Calculation (min)  51 min    Activity Tolerance  Patient tolerated treatment well;Patient limited by pain    Behavior During Therapy  [facility] for tasks assessed/performed       Past Medical History:  Diagnosis Date  . Complication of anesthesia    trouble waking up with back surgery  . Fracture    right elbow  . GERD (gastroesophageal reflux disease)   . High triglycerides   . Mentally disabled   . Previous back surgery 1999  . Schizophrenia (HCC)   . Scoliosis     Past Surgical History:  Procedure Laterality Date  . BACK SURGERY    . EYE SURGERY     37 year old  . ORIF ELBOW FRACTURE Right 10/26/2017   Procedure: OPEN REDUCTION INTERNAL FIXATION DISTAL HUMERUS;  Surgeon: Myrene GalasHandy, Michael, MD;  Location: [facility] OR;  Service: Orthopedics;  Laterality: Right;  . TONSILLECTOMY AND ADENOIDECTOMY    . WISDOM TOOTH EXTRACTION      There were no vitals filed for this visit.  Subjective Assessment - 12/05/17 1421    Subjective  Elbow feels fine but took tylenol earlier.     Patient is accompained by:  Family member    Currently in Pain?  No/denies   at rest        [facility] - 12/05/17 0001      AROM   Right Elbow Flexion  95    Right Elbow Extension  -55                   [facility] Adult PT Treatment/Exercise - 12/05/17 0001      Elbow Exercises   Other elbow exercises  elbow flexion and extension with and without stretch  Pronationnad supination and flex ext of wrist.       Cryotherapy   Number Minutes Cryotherapy  10 Minutes    Cryotherapy Location  --   RT elbowq   Type of Cryotherapy  Ice pack      Manual Therapy   Soft tissue mobilization  STM bicep    Passive ROM  PROM elbow flexion and extension to tolerance also mobs to joint and radial head GR 2-3                PT Short Term Goals - 11/22/17 1459      PT SHORT TERM GOAL #1   Title  He will have full active wrist ROM    Time  4    Period  Weeks    Status  New      PT SHORT TERM GOAL #2   Title  He will have active elbow flesion to 110 degrees    Time  4    Period  Weeks    Status  New      PT SHORT TERM GOAL #3  Title  He will have -30 degrees active RT elbow extension    Time  4    Period  Weeks    Status  New        PT Long Term Goals - 11/22/17 1500      PT LONG TERM GOAL #1   Title  He will be independent with all hEP issued    Time  12    Period  Weeks    Status  New      PT LONG TERM GOAL #2   Title  He will report able to return to cooking without help    Time  12    Period  Weeks    Status  New      PT LONG TERM GOAL #3   Title  he will have active RT elbow flexion to 125 degrees to allow normal liftingin kitchen    Time  12    Period  Weeks    Status  New      PT LONG TERM GOAL #4   Title  He will ahve active RT elbow extension  to allow for normal selfcare and cooking at home.     Time  12    Period  Weeks    Status  New      PT LONG TERM GOAL #5   Title  Hewill be able to lift 20 pounds from floor and carry 100 feet  without pain    Time  12    Period  Weeks    Status  New            Plan - 12/05/17 1447    Clinical Impression Statement  No changes until increased pressure with wtretching hen extension improved 10 degrees and flexion 3-4 degrees.   pain limits but still very tight end range    PT Treatment/Interventions  Cryotherapy;Therapeutic exercise;Therapeutic  activities;Patient/family education;Manual techniques;Passive range of motion;Taping    PT Next Visit Plan  ROM    PT Home Exercise Plan  Gentle elbow and wrist active and PROM     Consulted and Agree with Plan of Care  Patient;Family member/caregiver    Family Member Consulted  mother       Patient will benefit from skilled therapeutic intervention in order to improve the following deficits and impairments:  Pain, Impaired UE functional use, Decreased strength, Decreased range of motion, Decreased activity tolerance, Increased edema  Visit Diagnosis: Stiffness of right elbow joint  Pain in right elbow  Localized edema     Problem List Patient Active Problem List   Diagnosis Date Noted  . Schizophrenia, paranoid type (HCC) 06/19/2011    Class: Acute  . Mentally disabled 05/26/2011    Caprice RedChasse, Jocabed Cheese M  PT 12/05/2017, 3:01 PM  [facility] [address] [place], [place], [address] Phone: [phone]   Fax:  [phone]  Name: Andrew CornwallCurtis J Mordan MRN: [id number] Date of Birth: [birth year]

## 2017-12-07 ENCOUNTER — Ambulatory Visit: Payer: Medicare HMO

## 2017-12-07 DIAGNOSIS — M25621 Stiffness of right elbow, not elsewhere classified: Secondary | ICD-10-CM | POA: Diagnosis not present

## 2017-12-07 DIAGNOSIS — R600 Localized edema: Secondary | ICD-10-CM

## 2017-12-07 DIAGNOSIS — M25521 Pain in right elbow: Secondary | ICD-10-CM

## 2017-12-07 NOTE — Therapy (Addendum)
[facility] [address] [place], [place], [address] Phone: [phone]   Fax:  [phone]  Physical Therapy Treatment  Patient Details  Name: Andrew Conley MRN: [id number] Date of Birth: 07/03/1980 Referring Provider: Inda MerlinMicheal Handy, MD   Encounter Date: 12/07/2017  PT End of Session - 12/07/17 1407    Visit Number  4  (Pended)     Number of Visits  24  (Pended)     Date for PT Re-Evaluation  02/09/18  (Pended)     Authorization Type  Aetna MCR  (Pended)     PT Start Time  0207  (Pended)     PT Stop Time  0300  (Pended)     PT Time Calculation (min)  53 min  (Pended)     Activity Tolerance  Patient tolerated treatment well;Patient limited by pain  (Pended)     Behavior During Therapy  [facility] for tasks assessed/performed  (Pended)        Past Medical History:  Diagnosis Date  . Complication of anesthesia    trouble waking up with back surgery  . Fracture    right elbow  . GERD (gastroesophageal reflux disease)   . High triglycerides   . Mentally disabled   . Previous back surgery 1999  . Schizophrenia (HCC)   . Scoliosis     Past Surgical History:  Procedure Laterality Date  . BACK SURGERY    . EYE SURGERY     37 year old  . ORIF ELBOW FRACTURE Right 10/26/2017   Procedure: OPEN REDUCTION INTERNAL FIXATION DISTAL HUMERUS;  Surgeon: Myrene GalasHandy, Michael, MD;  [facility];  Service: Orthopedics;  Laterality: Right;  . TONSILLECTOMY AND ADENOIDECTOMY    . WISDOM TOOTH EXTRACTION      There were no vitals filed for this visit.  Subjective Assessment - 12/07/17 1501    Subjective  Some soreness post but did HEP and ready for stretching    Currently in Pain?  No/denies         [facility] - 12/07/17 0001      PROM   Right Elbow Extension  -42                   [facility] Adult PT Treatment/Exercise - 12/07/17 0001      Cryotherapy   Number Minutes Cryotherapy  10 Minutes    Cryotherapy  Location  --   RT elbow   Type of Cryotherapy  Ice pack      Manual Therapy   Soft tissue mobilization  STM bicep and tricep    Passive ROM  PROM elbow flexion and extension to tolerance also mobs to joint and radial head GR 2-3  ROM sustained for minutes at a time                PT Short Term Goals - 11/22/17 1459      PT SHORT TERM GOAL #1   Title  He will have full active wrist ROM    Time  4    Period  Weeks    Status  New      PT SHORT TERM GOAL #2   Title  He will have active elbow flesion to 110 degrees    Time  4    Period  Weeks    Status  New      PT SHORT TERM GOAL #3   Title  He will have -30 degrees active RT elbow  extension    Time  4    Period  Weeks    Status  New        PT Long Term Goals - 11/22/17 1500      PT LONG TERM GOAL #1   Title  He will be independent with all hEP issued    Time  12    Period  Weeks    Status  New      PT LONG TERM GOAL #2   Title  He will report able to return to cooking without help    Time  12    Period  Weeks    Status  New      PT LONG TERM GOAL #3   Title  he will have active RT elbow flexion to 125 degrees to allow normal liftingin kitchen    Time  12    Period  Weeks    Status  New      PT LONG TERM GOAL #4   Title  He will ahve active RT elbow extension  to allow for normal selfcare and cooking at home.     Time  12    Period  Weeks    Status  New      PT LONG TERM GOAL #5   Title  Hewill be able to lift 20 pounds from floor and carry 100 feet  without pain    Time  12    Period  Weeks    Status  New            Plan - 12/07/17 1407    Clinical Impression Statement  Passive extension improved from eval Elbow still swollen and very stiff.     PT Treatment/Interventions  Cryotherapy;Therapeutic exercise;Therapeutic activities;Patient/family education;Manual techniques;Passive range of motion;Taping    PT Next Visit Plan  ROM    PT Home Exercise Plan  Gentle elbow and wrist active and  PROM     Consulted and Agree with Plan of Care  Patient    Family Member Consulted  mother       Patient will benefit from skilled therapeutic intervention in order to improve the following deficits and impairments:  Pain, Impaired UE functional use, Decreased strength, Decreased range of motion, Decreased activity tolerance, Increased edema  Visit Diagnosis: Stiffness of right elbow joint  Pain in right elbow  Localized edema     Problem List Patient Active Problem List   Diagnosis Date Noted  . Schizophrenia, paranoid type (HCC) 06/19/2011    Class: Acute  . Mentally disabled 05/26/2011    Caprice Red  PT 12/07/2017, 3:02 PM  [facility] [address] [place], [place], [address] Phone: [phone]   Fax:  [phone]  Name: Andrew Conley MRN: [id number] Date of Birth: 01/15/1981

## 2017-12-12 ENCOUNTER — Ambulatory Visit: Payer: Medicare HMO | Admitting: Physical Therapy

## 2017-12-12 ENCOUNTER — Encounter: Payer: Self-pay | Admitting: Physical Therapy

## 2017-12-12 DIAGNOSIS — M25621 Stiffness of right elbow, not elsewhere classified: Secondary | ICD-10-CM

## 2017-12-12 DIAGNOSIS — R600 Localized edema: Secondary | ICD-10-CM | POA: Diagnosis not present

## 2017-12-12 DIAGNOSIS — M25521 Pain in right elbow: Secondary | ICD-10-CM | POA: Diagnosis not present

## 2017-12-12 NOTE — Therapy (Signed)
[facility] [address] [place], [place], [address] Phone: [phone]   Fax:  [phone]  Physical Therapy Treatment  Patient Details  Name: Andrew Conley MRN: [id number] Date of Birth: [birth year] Referring Provider: Inda Merlin, MD   Encounter Date: 12/12/2017  PT End of Session - 12/12/17 1507    Visit Number  4    Number of Visits  24    Date for PT Re-Evaluation  02/09/18    Authorization Type  Aetna MCR    PT Start Time  1507    PT Stop Time  1556    PT Time Calculation (min)  49 min    Activity Tolerance  Patient tolerated treatment well       Past Medical History:  Diagnosis Date  . Complication of anesthesia    trouble waking up with back surgery  . Fracture    right elbow  . GERD (gastroesophageal reflux disease)   . High triglycerides   . Mentally disabled   . Previous back surgery 1999  . Schizophrenia (HCC)   . Scoliosis     Past Surgical History:  Procedure Laterality Date  . BACK SURGERY    . EYE SURGERY     37 year old  . ORIF ELBOW FRACTURE Right 10/26/2017   Procedure: OPEN REDUCTION INTERNAL FIXATION DISTAL HUMERUS;  Surgeon: Myrene Galas, MD;  Location: [facility] OR;  Service: Orthopedics;  Laterality: Right;  . TONSILLECTOMY AND ADENOIDECTOMY    . WISDOM TOOTH EXTRACTION      There were no vitals filed for this visit.  Subjective Assessment - 12/12/17 1507    Subjective  Pt reports no pain only stiffness, doing well with HEP     Currently in Pain?  No/denies         [facility] PT Assessment - 12/12/17 0001      Assessment   Medical Diagnosis  ORIF RT olecranon and distal humerous      AROM   Right Elbow Flexion  95    Right Elbow Extension  -43                   [facility] Adult PT Treatment/Exercise - 12/12/17 0001      Exercises   Exercises  --   10x10sec scapular retraction     Elbow Exercises   Forearm Supination  AROM   with stretch into it holding 2# wt   Other  elbow exercises  low load long duration stretch into elbow for 5'    Other elbow exercises  pulley for shoulder and elbow      Wrist Exercises   Wrist Flexion  Strengthening;Right   to fatigue holding 2#   Wrist Extension  Strengthening;Right   to fatigue 2#      Modalities   Modalities  Vasopneumatic      Vasopneumatic   Number Minutes Vasopneumatic   10 minutes    Vasopnuematic Location   --   Rt elbow - used ankle cuff   Vasopneumatic Pressure  High    Vasopneumatic Temperature   3*      Manual Therapy   Manual Therapy  Joint mobilization    Joint Mobilization  Rt elbow distraction, mobs into extension, flexion and supination grade III with respect of pain    Passive ROM  PROM Rt elbow flex/ext               PT Short Term Goals - 12/12/17 1535  PT SHORT TERM GOAL #1   Title  He will have full active wrist ROM      PT SHORT TERM GOAL #2   Title  He will have active elbow flesion to 110 degrees    Status  On-going      PT SHORT TERM GOAL #3   Title  He will have -30 degrees active RT elbow extension    Status  On-going   making progress       PT Long Term Goals - 12/12/17 1535      PT LONG TERM GOAL #1   Title  He will be independent with all hEP issued    Status  On-going      PT LONG TERM GOAL #2   Title  He will report able to return to cooking without help    Status  On-going      PT LONG TERM GOAL #3   Title  he will have active RT elbow flexion to 125 degrees to allow normal liftingin kitchen    Status  On-going      PT LONG TERM GOAL #4   Title  He will ahve active RT elbow extension  to allow for normal selfcare and cooking at home.     Status  On-going      PT LONG TERM GOAL #5   Title  Hewill be able to lift 20 pounds from floor and carry 100 feet  without pain    Status  On-going            Plan - 12/12/17 1552    Clinical Impression Statement  Pt had slight pain with endrange of motion Rt elbow, he has improved  extension and the same amount of flexion.  Supination is almost full.  He still has edema in the Rt arm down into the hand. recommend he begin ball squeezes with Rt hand above hear to help with that.  Making slow progress to goals.  We are having to cancel Fridays appointment he is unable to come tomorrow instead.     Rehab Potential  Good    PT Frequency  2x / week    PT Duration  12 weeks    PT Treatment/Interventions  Cryotherapy;Therapeutic exercise;Therapeutic activities;Patient/family education;Manual techniques;Passive range of motion;Taping    PT Next Visit Plan  ROM    Consulted and Agree with Plan of Care  Patient;Family member/caregiver    Family Member Consulted  mother       Patient will benefit from skilled therapeutic intervention in order to improve the following deficits and impairments:  Pain, Impaired UE functional use, Decreased strength, Decreased range of motion, Decreased activity tolerance, Increased edema  Visit Diagnosis: Stiffness of right elbow joint  Pain in right elbow  Localized edema     Problem List Patient Active Problem List   Diagnosis Date Noted  . Schizophrenia, paranoid type (HCC) 06/19/2011    Class: Acute  . Mentally disabled 05/26/2011    Andrew ScarceSusan Latonda Conley PT  12/12/2017, 3:56 PM  [facility] [address] [place], [place], [address] Phone: [phone]   Fax:  [phone]  Name: Andrew CornwallCurtis J Conley MRN: [id number] Date of Birth: 1980/12/04

## 2017-12-12 NOTE — Patient Instructions (Signed)
Low load long duration stretch for elbow extension  Hold a 2# bag of beans in your Right hand, allow the arm to dangle by your side for a gentle stretch  Hold for 5-10 minutes to allow muscles to stretch out.    If you want you can use heat on the elbow for ~ 10 min before performing the stretch    Increase ice use to 3-4 times a day for 10-15 min, if possible elevate elbow above heart.

## 2017-12-15 ENCOUNTER — Ambulatory Visit: Payer: Medicare HMO | Admitting: Physical Therapy

## 2017-12-19 ENCOUNTER — Encounter: Payer: Self-pay | Admitting: Physical Therapy

## 2017-12-19 ENCOUNTER — Ambulatory Visit: Payer: Medicare HMO | Attending: Orthopedic Surgery | Admitting: Physical Therapy

## 2017-12-19 DIAGNOSIS — M25521 Pain in right elbow: Secondary | ICD-10-CM | POA: Diagnosis not present

## 2017-12-19 DIAGNOSIS — R600 Localized edema: Secondary | ICD-10-CM | POA: Insufficient documentation

## 2017-12-19 DIAGNOSIS — M25621 Stiffness of right elbow, not elsewhere classified: Secondary | ICD-10-CM

## 2017-12-19 NOTE — Therapy (Signed)
[facility] [address] [place], [place], [address] Phone: [phone]   Fax:  [phone]  Physical Therapy Treatment  Patient Details  Name: Andrew Conley MRN: [id number] Date of Birth: 1980-04-25 Referring Provider: Inda Merlin, MD   Encounter Date: 12/19/2017  PT End of Session - 12/19/17 1332    Visit Number  5    Number of Visits  24    Date for PT Re-Evaluation  02/09/18    Authorization Type  Aetna MCR    PT Start Time  0130    PT Stop Time  0225    PT Time Calculation (min)  55 min       Past Medical History:  Diagnosis Date  . Complication of anesthesia    trouble waking up with back surgery  . Fracture    right elbow  . GERD (gastroesophageal reflux disease)   . High triglycerides   . Mentally disabled   . Previous back surgery 1999  . Schizophrenia (HCC)   . Scoliosis     Past Surgical History:  Procedure Laterality Date  . BACK SURGERY    . EYE SURGERY     37 year old  . ORIF ELBOW FRACTURE Right 10/26/2017   Procedure: OPEN REDUCTION INTERNAL FIXATION DISTAL HUMERUS;  Surgeon: Myrene Galas, MD;  Location: [facility] OR;  Service: Orthopedics;  Laterality: Right;  . TONSILLECTOMY AND ADENOIDECTOMY    . WISDOM TOOTH EXTRACTION      There were no vitals filed for this visit.  Subjective Assessment - 12/19/17 1331    Subjective  No pain , stiff. Tried holding long stretch for elbow 1 x per day.     Currently in Pain?  No/denies                       [facility] Adult PT Treatment/Exercise - 12/19/17 0001      Elbow Exercises   Forearm Supination  PROM;AROM   holding wooden handle    Other elbow exercises  2# bicep curls standing, yellow band bicep curls supine, red band tricp press seated.    Other elbow exercises  pulley for shoulder and elbow      Wrist Exercises   Wrist Flexion  Strengthening;Right   to fatigue holding 2#   Wrist Extension  Strengthening;Right   to fatigue 2#      Modalities   Modalities  Moist Heat      Moist Heat Therapy   Number Minutes Moist Heat  10 Minutes    Moist Heat Location  Elbow   right     Manual Therapy   Manual therapy comments  contract elbow flexion and extension x 4 each     Joint Mobilization  t elbow distract with flexion,    Passive ROM  PROM Rt elbow flex/ext, pronation and supination               PT Short Term Goals - 12/12/17 1535      PT SHORT TERM GOAL #1   Title  He will have full active wrist ROM      PT SHORT TERM GOAL #2   Title  He will have active elbow flesion to 110 degrees    Status  On-going      PT SHORT TERM GOAL #3   Title  He will have -30 degrees active RT elbow extension    Status  On-going   making progress  PT Long Term Goals - 12/12/17 1535      PT LONG TERM GOAL #1   Title  He will be independent with all hEP issued    Status  On-going      PT LONG TERM GOAL #2   Title  He will report able to return to cooking without help    Status  On-going      PT LONG TERM GOAL #3   Title  he will have active RT elbow flexion to 125 degrees to allow normal liftingin kitchen    Status  On-going      PT LONG TERM GOAL #4   Title  He will ahve active RT elbow extension  to allow for normal selfcare and cooking at home.     Status  On-going      PT LONG TERM GOAL #5   Title  Hewill be able to lift 20 pounds from floor and carry 100 feet  without pain    Status  On-going            Plan - 12/19/17 1518    Clinical Impression Statement  Pt and mother report elbow is not where they would like it to be functionally. He is unable to feed himself with his right arm. Increased pain with end range stretching, Contract relax used to increased ROM> Most pain with endrange supination and elbow flexion in supination. One more week until MD appointment,     PT Next Visit Plan  ROM    PT Home Exercise Plan  Gentle elbow and wrist active and PROM     Consulted and Agree with Plan  of Care  Patient;Family member/caregiver    Family Member Consulted  mother       Patient will benefit from skilled therapeutic intervention in order to improve the following deficits and impairments:  Pain, Impaired UE functional use, Decreased strength, Decreased range of motion, Decreased activity tolerance, Increased edema  Visit Diagnosis: Stiffness of right elbow joint  Pain in right elbow  Localized edema     Problem List Patient Active Problem List   Diagnosis Date Noted  . Schizophrenia, paranoid type (HCC) 06/19/2011    Class: Acute  . Mentally disabled 05/26/2011    Sherrie Mustache, PTA 12/19/2017, 3:21 PM  [facility] [address] [place], [place], [address] Phone: [phone]   Fax:  [phone]  Name: Andrew Conley MRN: [id number] Date of Birth: Sep 13, 1980

## 2017-12-21 ENCOUNTER — Ambulatory Visit: Payer: Medicare HMO

## 2017-12-21 DIAGNOSIS — R600 Localized edema: Secondary | ICD-10-CM

## 2017-12-21 DIAGNOSIS — M25621 Stiffness of right elbow, not elsewhere classified: Secondary | ICD-10-CM | POA: Diagnosis not present

## 2017-12-21 DIAGNOSIS — M25521 Pain in right elbow: Secondary | ICD-10-CM | POA: Diagnosis not present

## 2017-12-21 NOTE — Therapy (Signed)
[facility] Outpatient [facility] [address] [place], [place], [address] Phone: [phone]   Fax:  [phone]  Physical Therapy Treatment  Patient Details  Name: Andrew Conley MRN: [id number] Date of Birth: 05-03-1980 Referring Provider: Inda Merlin, MD   Encounter Date: 12/21/2017  PT End of Session - 12/21/17 1426    Visit Number  6    Number of Visits  24    Date for PT Re-Evaluation  02/09/18    Authorization Type  Aetna [facility]    PT Start Time  0218    PT Stop Time  0310    PT Time Calculation (min)  52 min    Activity Tolerance  Patient tolerated treatment well    Behavior During Therapy  [facility] for tasks assessed/performed       Past Medical History:  Diagnosis Date  . Complication of anesthesia    trouble waking up with back surgery  . Fracture    right elbow  . GERD (gastroesophageal reflux disease)   . High triglycerides   . Mentally disabled   . Previous back surgery 1999  . Schizophrenia (HCC)   . Scoliosis     Past Surgical History:  Procedure Laterality Date  . BACK SURGERY    . EYE SURGERY     37 year old  . ORIF ELBOW FRACTURE Right 10/26/2017   Procedure: OPEN REDUCTION INTERNAL FIXATION DISTAL HUMERUS;  Surgeon: Myrene Galas, MD;  Location: [facility] OR;  Service: Orthopedics;  Laterality: Right;  . TONSILLECTOMY AND ADENOIDECTOMY    . WISDOM TOOTH EXTRACTION      There were no vitals filed for this visit.      [facility] PT Assessment - 12/21/17 0001      AROM   Right Elbow Flexion  99    Right Elbow Extension  -43      PROM   Right Elbow Flexion  105    Right Elbow Extension  -40                   [facility] Adult PT Treatment/Exercise - 12/21/17 0001      Elbow Exercises   Forearm Supination  PROM;AROM    Other elbow exercises  3# bicep curls seated x 30,     Other elbow exercises  pulley for shoulder and elbow      Wrist Exercises   Wrist Flexion  Right;Seated   30 reps 3 pounds   Wrist  Extension  Right;Seated   3 pounds x 30     Moist Heat Therapy   Number Minutes Moist Heat  10 Minutes    Moist Heat Location  Elbow   RT     Manual Therapy   Manual therapy comments  contract elbow flexion and extension x 4 each     Joint Mobilization  t elbow distract with flexion,    Passive ROM  PROM Rt elbow flex/ext, pronation and supination               PT Short Term Goals - 12/12/17 1535      PT SHORT TERM GOAL #1   Title  He will have full active wrist ROM      PT SHORT TERM GOAL #2   Title  He will have active elbow flesion to 110 degrees    Status  On-going      PT SHORT TERM GOAL #3   Title  He will have -30 degrees active RT elbow extension  Status  On-going   making progress       PT Long Term Goals - 12/12/17 1535      PT LONG TERM GOAL #1   Title  He will be independent with all hEP issued    Status  On-going      PT LONG TERM GOAL #2   Title  He will report able to return to cooking without help    Status  On-going      PT LONG TERM GOAL #3   Title  he will have active RT elbow flexion to 125 degrees to allow normal liftingin kitchen    Status  On-going      PT LONG TERM GOAL #4   Title  He will ahve active RT elbow extension  to allow for normal selfcare and cooking at home.     Status  On-going      PT LONG TERM GOAL #5   Title  Hewill be able to lift 20 pounds from floor and carry 100 feet  without pain    Status  On-going            Plan - 12/21/17 1505    PT Treatment/Interventions  Cryotherapy;Therapeutic exercise;Therapeutic activities;Patient/family education;Manual techniques;Passive range of motion;Taping    PT Next Visit Plan  ROM, gentle active exer with 2-3 pound weight , pain as tolerated    PT Home Exercise Plan  Gentle elbow and wrist active and PROM     Consulted and Agree with Plan of Care  Patient    Family Member Consulted  mother       Patient will benefit from skilled therapeutic intervention in  order to improve the following deficits and impairments:  Pain, Impaired UE functional use, Decreased strength, Decreased range of motion, Decreased activity tolerance, Increased edema  Visit Diagnosis: Stiffness of right elbow joint  Pain in right elbow  Localized edema     Problem List Patient Active Problem List   Diagnosis Date Noted  . Schizophrenia, paranoid type (HCC) 06/19/2011    Class: Acute  . Mentally disabled 05/26/2011    Caprice Red PT 12/21/2017, 3:06 PM  [facility] [address] [place], [place], [address] Phone: [phone]   Fax:  [phone]  Name: Andrew Conley MRN: [id number] Date of Birth: [birth year]

## 2017-12-26 ENCOUNTER — Ambulatory Visit: Payer: Medicare HMO

## 2017-12-26 DIAGNOSIS — M25521 Pain in right elbow: Secondary | ICD-10-CM

## 2017-12-26 DIAGNOSIS — M546 Pain in thoracic spine: Secondary | ICD-10-CM | POA: Diagnosis not present

## 2017-12-26 DIAGNOSIS — M4125 Other idiopathic scoliosis, thoracolumbar region: Secondary | ICD-10-CM | POA: Diagnosis not present

## 2017-12-26 DIAGNOSIS — M25621 Stiffness of right elbow, not elsewhere classified: Secondary | ICD-10-CM

## 2017-12-26 DIAGNOSIS — R600 Localized edema: Secondary | ICD-10-CM | POA: Diagnosis not present

## 2017-12-26 DIAGNOSIS — M47816 Spondylosis without myelopathy or radiculopathy, lumbar region: Secondary | ICD-10-CM | POA: Diagnosis not present

## 2017-12-26 DIAGNOSIS — G894 Chronic pain syndrome: Secondary | ICD-10-CM | POA: Diagnosis not present

## 2017-12-26 NOTE — Therapy (Signed)
[facility] Outpatient Rehabilitation [facility] [address] [place], [place], [address] Phone: [phone]   Fax:  [phone]  Physical Therapy Treatment  Patient Details  Name: Andrew Conley MRN: [id number] Date of Birth: 02-16-1981 Referring Provider: Inda Merlin, MD   Encounter Date: 12/26/2017  PT End of Session - 12/26/17 1414    Visit Number  7    Number of Visits  24    Date for PT Re-Evaluation  02/09/18    Authorization Type  Aetna MCR    PT Start Time  0200    PT Stop Time  0253    PT Time Calculation (min)  53 min    Activity Tolerance  Patient tolerated treatment well;Patient limited by pain    Behavior During Therapy  [facility] for tasks assessed/performed       Past Medical History:  Diagnosis Date  . Complication of anesthesia    trouble waking up with back surgery  . Fracture    right elbow  . GERD (gastroesophageal reflux disease)   . High triglycerides   . Mentally disabled   . Previous back surgery 1999  . Schizophrenia (HCC)   . Scoliosis     Past Surgical History:  Procedure Laterality Date  . BACK SURGERY    . EYE SURGERY     37 year old  . ORIF ELBOW FRACTURE Right 10/26/2017   Procedure: OPEN REDUCTION INTERNAL FIXATION DISTAL HUMERUS;  Surgeon: Myrene Galas, MD;  Location: [facility] OR;  Service: Orthopedics;  Laterality: Right;  . TONSILLECTOMY AND ADENOIDECTOMY    . WISDOM TOOTH EXTRACTION      There were no vitals filed for this visit.  Subjective Assessment - 12/26/17 1415    Subjective  Mother relates she feel can get hand closer to mouth    Currently in Pain?  No/denies         [facility] PT Assessment - 12/26/17 0001      AROM   Right Elbow Flexion  104   can get thumb /fist to nose   Right Elbow Extension  -44    Right Forearm Pronation  64 Degrees    Right Forearm Supination  65 Degrees   Less pain   Right Wrist Extension  35 Degrees    Right Wrist Flexion  65 Degrees    Right Wrist Radial Deviation  60  Degrees      PROM   Right Elbow Flexion  110    Right Elbow Extension  -40                   [facility] Adult PT Treatment/Exercise - 12/26/17 0001      Elbow Exercises   Elbow Flexion  Right;Supine   30 reps   Elbow Extension  Right;Supine;Other reps (comment)   30 reps   Forearm Supination  PROM;AROM    Other elbow exercises  pulley for shoulder and elbow      Wrist Exercises   Wrist Flexion  Right;20 reps    Wrist Extension  Right;20 reps      Cryotherapy   Number Minutes Cryotherapy  10 Minutes    Cryotherapy Location  --   RT elbow   Type of Cryotherapy  Ice pack      Manual Therapy   Manual therapy comments  contract elbow flexion and extension x 4 each     Joint Mobilization  t elbow distract with flexion,    Passive ROM  PROM Rt elbow flex/ext, pronation  and supination               PT Short Term Goals - 12/26/17 1448      PT SHORT TERM GOAL #1   Title  He will have full active wrist ROM    Baseline  still limited    Status  On-going      PT SHORT TERM GOAL #2   Title  He will have active elbow flexion to 110 degrees    Baseline  passive to 110    Status  On-going      PT SHORT TERM GOAL #3   Title  He will have -30 degrees active RT elbow extension    Baseline  -45 degrees    Status  On-going        PT Long Term Goals - 12/26/17 1448      PT LONG TERM GOAL #1   Title  He will be independent with all hEP issued    Status  On-going      PT LONG TERM GOAL #2   Title  He will report able to return to cooking without help    Status  On-going      PT LONG TERM GOAL #3   Title  he will have active RT elbow flexion to 125 degrees to allow normal liftingin kitchen    Status  On-going      PT LONG TERM GOAL #4   Title  He will ahve active RT elbow extension  to allow for normal selfcare and cooking at home.     Status  On-going      PT LONG TERM GOAL #5   Title  Hewill be able to lift 20 pounds from floor and carry 100 feet   without pain    Baseline  not allowed    Status  On-going            Plan - 12/26/17 1414    Clinical Impression Statement  He continues with limited ROM though improved.  He has less pain with  supination.  progress is slow but measurable and pain in under control. We have had him do some light weight with ext and lfexion andpronation and supination.     PT Treatment/Interventions  Cryotherapy;Therapeutic exercise;Therapeutic activities;Patient/family education;Manual techniques;Passive range of motion;Taping    PT Next Visit Plan  ROM, gentle active exer with 2-3 pound weight , pain as tolerated, progress as allowed by MD    PT Home Exercise Plan  Gentle elbow and wrist active and PROM     Consulted and Agree with Plan of Care  Patient    Family Member Consulted  mother       Patient will benefit from skilled therapeutic intervention in order to improve the following deficits and impairments:  Pain, Impaired UE functional use, Decreased strength, Decreased range of motion, Decreased activity tolerance, Increased edema  Visit Diagnosis: Stiffness of right elbow joint  Pain in right elbow  Localized edema     Problem List Patient Active Problem List   Diagnosis Date Noted  . Schizophrenia, paranoid type (HCC) 06/19/2011    Class: Acute  . Mentally disabled 05/26/2011    Caprice Red  PT 12/26/2017, 2:52 PM  [facility] [address] [place], [place], [address] Phone: [phone]   Fax:  [phone]  Name: Andrew Conley MRN: [id number] Date of Birth: Feb 14, 1981

## 2017-12-27 DIAGNOSIS — S42471D Displaced transcondylar fracture of right humerus, subsequent encounter for fracture with routine healing: Secondary | ICD-10-CM | POA: Diagnosis not present

## 2017-12-27 DIAGNOSIS — S42401D Unspecified fracture of lower end of right humerus, subsequent encounter for fracture with routine healing: Secondary | ICD-10-CM | POA: Diagnosis not present

## 2018-01-02 ENCOUNTER — Ambulatory Visit: Payer: Medicare HMO

## 2018-01-02 DIAGNOSIS — M25621 Stiffness of right elbow, not elsewhere classified: Secondary | ICD-10-CM

## 2018-01-02 DIAGNOSIS — M25521 Pain in right elbow: Secondary | ICD-10-CM

## 2018-01-02 DIAGNOSIS — R600 Localized edema: Secondary | ICD-10-CM

## 2018-01-02 NOTE — Therapy (Signed)
[facility] Outpatient Rehabilitation [facility] [address] [place], [place], [address] Phone: [phone]   Fax:  [phone]  Physical Therapy Treatment  Patient Details  Name: Andrew Conley MRN: [id number] Date of Birth: 1981/01/18 Referring Provider: Inda Merlin, MD   Encounter Date: 01/02/2018  PT End of Session - 01/02/18 1501    Visit Number  8    Number of Visits  24    Date for PT Re-Evaluation  02/09/18    Authorization Type  Aetna [facility]    PT Start Time  0215    PT Stop Time  0305    PT Time Calculation (min)  50 min    Activity Tolerance  Patient tolerated treatment well;Patient limited by pain    Behavior During Therapy  [facility] for tasks assessed/performed       Past Medical History:  Diagnosis Date  . Complication of anesthesia    trouble waking up with back surgery  . Fracture    right elbow  . GERD (gastroesophageal reflux disease)   . High triglycerides   . Mentally disabled   . Previous back surgery 1999  . Schizophrenia (HCC)   . Scoliosis     Past Surgical History:  Procedure Laterality Date  . BACK SURGERY    . EYE SURGERY     37 year old  . ORIF ELBOW FRACTURE Right 10/26/2017   Procedure: OPEN REDUCTION INTERNAL FIXATION DISTAL HUMERUS;  Surgeon: Myrene Galas, MD;  Location: [facility] OR;  Service: Orthopedics;  Laterality: Right;  . TONSILLECTOMY AND ADENOIDECTOMY    . WISDOM TOOTH EXTRACTION      There were no vitals filed for this visit.  Subjective Assessment - 01/02/18 1417    Subjective  Mother rports MD wanted [name] splint  to try to avoid  further surgery.     Currently in Pain?  No/denies   at rest        [facility] PT Assessment - 01/02/18 0001      AROM   Right Elbow Extension  -42      PROM   Right Elbow Extension  -36                   [facility] Adult PT Treatment/Exercise - 01/02/18 0001      Elbow Exercises   Other elbow exercises  pulley for shoulder and elbow,    Supine diagonal from LT hip tpo  ER over head      Shoulder Exercises: Standing   Other Standing Exercises  red band rows and supination with hor abduciton x 15 each short range.  and triceps ext with shoulder ext x 15 also cued for breathing inhale on pull      Manual stretching flexion and extension with distraction and Gr 3-4 mobs to radial head and elbow jt         PT Short Term Goals - 12/26/17 1448      PT SHORT TERM GOAL #1   Title  He will have full active wrist ROM    Baseline  still limited    Status  On-going      PT SHORT TERM GOAL #2   Title  He will have active elbow flexion to 110 degrees    Baseline  passive to 110    Status  On-going      PT SHORT TERM GOAL #3   Title  He will have -30 degrees active RT elbow extension    Baseline  -45  degrees    Status  On-going        PT Long Term Goals - 12/26/17 1448      PT LONG TERM GOAL #1   Title  He will be independent with all hEP issued    Status  On-going      PT LONG TERM GOAL #2   Title  He will report able to return to cooking without help    Status  On-going      PT LONG TERM GOAL #3   Title  he will have active RT elbow flexion to 125 degrees to allow normal liftingin kitchen    Status  On-going      PT LONG TERM GOAL #4   Title  He will ahve active RT elbow extension  to allow for normal selfcare and cooking at home.     Status  On-going      PT LONG TERM GOAL #5   Title  Hewill be able to lift 20 pounds from floor and carry 100 feet  without pain    Baseline  not allowed    Status  On-going            Plan - 01/02/18 1500    Clinical Impression Statement  Continue limtations . MD wants [name] splint for elbow.  Full supination befor epain occurs . Tolerates mild to mod resistance without pain .      PT Treatment/Interventions  Cryotherapy;Therapeutic exercise;Therapeutic activities;Patient/family education;Manual techniques;Passive range of motion;Taping    PT Next Visit Plan  ROM, gentle active exer with 2-3 pound  weight , pain as tolerated, progress as allowed by MD    PT Home Exercise Plan  Gentle elbow and wrist active and PROM     Consulted and Agree with Plan of Care  Patient    Family Member Consulted  mother       Patient will benefit from skilled therapeutic intervention in order to improve the following deficits and impairments:  Pain, Impaired UE functional use, Decreased strength, Decreased range of motion, Decreased activity tolerance, Increased edema  Visit Diagnosis: Stiffness of right elbow joint  Pain in right elbow  Localized edema     Problem List Patient Active Problem List   Diagnosis Date Noted  . Schizophrenia, paranoid type (HCC) 06/19/2011    Class: Acute  . Mentally disabled 05/26/2011    Caprice RedChasse, Sundus Pete M  PT 01/02/2018, 3:50 PM  [facility] [address] [place], [place], [address] Phone: [phone]   Fax:  [phone]  Name: Andrew Conley MRN: [id number] Date of Birth: 1980/09/17

## 2018-01-04 ENCOUNTER — Ambulatory Visit: Payer: Medicare HMO

## 2018-01-04 DIAGNOSIS — M25521 Pain in right elbow: Secondary | ICD-10-CM | POA: Diagnosis not present

## 2018-01-04 DIAGNOSIS — R600 Localized edema: Secondary | ICD-10-CM | POA: Diagnosis not present

## 2018-01-04 DIAGNOSIS — M25621 Stiffness of right elbow, not elsewhere classified: Secondary | ICD-10-CM | POA: Diagnosis not present

## 2018-01-04 NOTE — Therapy (Signed)
[place], [place], [address] Phone: [phone]   Fax:  [phone]  Physical Therapy Treatment  Patient Details  Name: Andrew [place] MRN: [id number] Date of Birth: 06/26/1980 Referring Provider: Crawford Givens, MD   Encounter Date: 01/04/2018  PT End of Session - 01/04/18 1411    Visit Number  9    Number of Visits  24    Date for PT Re-Evaluation  02/09/18    Authorization Type  A[employer]na MCR    PT Start Time  0205    PT Stop Time  0300    PT Time Calculation (min)  55 min    Activity Tolerance  Patient tolerated treatment well;Patient limited by pain    Behavior During Therapy  [facility] for tasks assessed/performed       Past Medical History:  Diagnosis Date  . Complication of anesthesia    trouble waking up with back surgery  . Fracture    right elbow  . GERD (gastroesophageal reflux disease)   . High triglycerides   . Mentally disabled   . Previous back surgery 1999  . Schizophrenia ([place])   . Scoliosis     Past Surgical History:  Procedure Laterality Date  . BACK SURGERY    . EYE SURGERY     37 year old  . ORIF ELBOW FRACTURE Right 10/26/2017   Procedure: OPEN REDUCTION INTERNAL FIXATION DISTAL HUMERUS;  Surgeon: Altamese [place], MD;  Location: [place];  Service: Orthopedics;  Laterality: Right;  . TONSILLECTOMY AND ADENOIDECTOMY    . WISDOM TOOTH EXTRACTION      There were no vitals filed for this visit.  Subjective Assessment - 01/04/18 1412    Subjective  New sleeve that is tighter.  no pain    Patient is accompained by:  Family member    Currently in Pain?  No/denies         [facility] PT Assessment - 01/04/18 0001      AROM   Right Elbow Flexion  109    Right Elbow Extension  -42   after stretching     PROM   Right Elbow Flexion  104    Right Elbow Extension  -36                   [facility] Adult PT Treatment/Exercise - 01/04/18 0001      Elbow Exercises   Other  elbow exercises  pulley for shoulder and elbow,    Supine diagonal from LT hip tpo ER over head      Shoulder Exercises: Standing   Other Standing Exercises  red band rows and supination with hor abduciton x 15 each short range.  and triceps ext with shoulder ext x 15 also cued for breathing inhale on pull      Moist Heat Therapy   Number Minutes Moist Heat  10 Minutes    Moist Heat Location  Elbow   R     Manual Therapy   Joint Mobilization  t elbow distract with flexion,    Soft tissue mobilization  STM bicep and tricep    Passive ROM  PROM Rt elbow flex/ext, pronation and supination               PT Short Term Goals - 01/04/18 1453      PT SHORT TERM GOAL #1   Title  He will have full active wrist ROM    Baseline  WNL with some  end range pain with supination    Status  Partially Met      PT SHORT TERM GOAL #2   Title  He will have active elbow flexion to 110 degrees    Baseline  109 today    Status  Partially Met      PT SHORT TERM GOAL #3   Title  He will have -30 degrees active RT elbow extension    Baseline  -42 post stretch    Status  On-going        PT Long Term Goals - 01/04/18 1453      PT LONG TERM GOAL #1   Title  He will be independent with all hEP issued    Status  On-going      PT LONG TERM GOAL #2   Title  He will report able to return to cooking without help    Status  On-going      PT LONG TERM GOAL #3   Title  he will have active RT elbow flexion to 125 degrees to allow normal liftingin kitchen    Status  On-going      PT LONG TERM GOAL #4   Title  He will ahve active RT elbow extension  to allow for normal selfcare and cooking at home.     Status  On-going      PT LONG TERM GOAL #5   Title  Hewill be able to lift 20 pounds from floor and carry 100 feet  without pain    Baseline  No assessed    Status  On-going            Plan - 01/04/18 1412    Clinical Impression Statement  Measurements taken and info to be faxed to [facility].     Ext ROM about the same.   Flexion incr 5 degrees    PT Treatment/Interventions  Cryotherapy;Therapeutic exercise;Therapeutic activities;Patient/family education;Manual techniques;Passive range of motion;Taping    PT Next Visit Plan  ROM, gentle active exer with 2-3 pound weight , pain as tolerated, progress as allowed by MD    PT Home Exercise Plan  Gentle elbow and wrist active and PROM     Consulted and Agree with Plan of Care  Patient;Family member/caregiver    Family Member Consulted  mother       Patient will benefit from skilled therapeutic intervention in order to improve the following deficits and impairments:  Pain, Impaired UE functional use, Decreased strength, Decreased range of motion, Decreased activity tolerance, Increased edema  Visit Diagnosis: Stiffness of right elbow joint  Pain in right elbow  Localized edema     Problem List Patient Active Problem List   Diagnosis Date Noted  . Schizophrenia, paranoid type ([place]) 06/19/2011    Class: Acute  . Mentally disabled 05/26/2011    Darrel Hoover  PT 01/04/2018, 2:56 PM  [facility] [address] [place], [place], [address] Phone: [phone]   Fax:  [phone]  Name: Andrew [place] MRN: [id number] Date of Birth: 12-20-1980

## 2018-01-15 DIAGNOSIS — R69 Illness, unspecified: Secondary | ICD-10-CM | POA: Diagnosis not present

## 2018-01-16 ENCOUNTER — Ambulatory Visit: Payer: Medicare HMO | Attending: Orthopedic Surgery

## 2018-01-16 DIAGNOSIS — M25521 Pain in right elbow: Secondary | ICD-10-CM | POA: Insufficient documentation

## 2018-01-16 DIAGNOSIS — R600 Localized edema: Secondary | ICD-10-CM | POA: Insufficient documentation

## 2018-01-16 DIAGNOSIS — M25621 Stiffness of right elbow, not elsewhere classified: Secondary | ICD-10-CM | POA: Insufficient documentation

## 2018-01-16 NOTE — Therapy (Addendum)
[place], [place], [address] Phone: [phone]   Fax:  [phone]  Physical Therapy Treatment  Patient Details  Name: Andrew [place] MRN: [id number] Date of Birth: [birth year] Referring Provider (PT): Crawford Givens, MD Progress Note Reporting Period 11/22/17 to 01/16/18  See note below for Objective Data and Assessment of Progress/Goals.      Encounter Date: 01/16/2018  PT End of Session - 01/16/18 1458    Visit Number  10    Number of Visits  24    Date for PT Re-Evaluation  02/09/18    Authorization Type  [employer]etna MCR    PT Start Time  0205    PT Stop Time  0300    PT Time Calculation (min)  55 min    Activity Tolerance  Patient tolerated treatment well;Patient limited by pain    Behavior During Therapy  [facility] for tasks assessed/performed       Past Medical History:  Diagnosis Date  . Complication of anesthesia    trouble waking up with back surgery  . Fracture    right elbow  . GERD (gastroesophageal reflux disease)   . High triglycerides   . Mentally disabled   . Previous back surgery 1999  . Schizophrenia ([place])   . Scoliosis     Past Surgical History:  Procedure Laterality Date  . BACK SURGERY    . EYE SURGERY     37 year old  . ORIF ELBOW FRACTURE Right 10/26/2017   Procedure: OPEN REDUCTION INTERNAL FIXATION DISTAL HUMERUS;  Surgeon: Altamese [place], MD;  Location: [place];  Service: Orthopedics;  Laterality: Right;  . TONSILLECTOMY AND ADENOIDECTOMY    . WISDOM TOOTH EXTRACTION      There were no vitals filed for this visit.  Subjective Assessment - 01/16/18 1410    Subjective  No brace yet .  no complaints    Currently in Pain?  No/denies         [facility] PT Assessment - 01/16/18 0001      AROM   Right Elbow Flexion  113    Right Elbow Extension  -43      PROM   Right Elbow Extension  -38                   [facility] Adult PT Treatment/Exercise - 01/16/18 0001       Shoulder Exercises: Supine   Other Supine Exercises  resisted IR / ER and elbow flexion  extesnion x 20-30 reps resistance as tolerated and with active ROM elb flexion and extension      Shoulder Exercises: Pulleys   Flexion  3 minutes      Moist Heat Therapy   Number Minutes Moist Heat  10 Minutes    Moist Heat Location  Elbow       Manual with distraction , flexion and extension low load longer duration, and with contract relax        PT Short Term Goals - 01/04/18 1453      PT SHORT TERM GOAL #1   Title  He will have full active wrist ROM    Baseline  WNL with some end range pain with supination    Status  Partially Met      PT SHORT TERM GOAL #2   Title  He will have active elbow flexion to 110 degrees    Baseline  109 today    Status  Partially Met  PT SHORT TERM GOAL #3   Title  He will have -30 degrees active RT elbow extension    Baseline  -42 post stretch    Status  On-going        PT Long Term Goals - 01/04/18 1453      PT LONG TERM GOAL #1   Title  He will be independent with all hEP issued    Status  On-going      PT LONG TERM GOAL #2   Title  He will report able to return to cooking without help    Status  On-going      PT LONG TERM GOAL #3   Title  he will have active RT elbow flexion to 125 degrees to allow normal liftingin kitchen    Status  On-going      PT LONG TERM GOAL #4   Title  He will ahve active RT elbow extension  to allow for normal selfcare and cooking at home.     Status  On-going      PT LONG TERM GOAL #5   Title  Hewill be able to lift 20 pounds from floor and carry 100 feet  without pain    Baseline  No assessed    Status  On-going            Plan - 01/16/18 1445    Clinical Impression Statement  no significnat chang + or - .   tolerateing stretching and giving good resistance without pain.   Hope [name] will be inpalce in next week.     PT Treatment/Interventions  Cryotherapy;Therapeutic exercise;Therapeutic  activities;Patient/family education;Manual techniques;Passive range of motion;Taping    PT Next Visit Plan  ROM, gentle active exer with 2-3 pound weight , pain as tolerated, progress as allowed by MD,  add band exercises for home. yellow or red    PT Home Exercise Plan  Gentle elbow and wrist active and PROM     Consulted and Agree with Plan of Care  Patient;Family member/caregiver    Family Member Consulted  mother       Patient will benefit from skilled therapeutic intervention in order to improve the following deficits and impairments:  Pain, Impaired UE functional use, Decreased strength, Decreased range of motion, Decreased activity tolerance, Increased edema  Visit Diagnosis: Stiffness of right elbow joint  Pain in right elbow  Localized edema     Problem List Patient Active Problem List   Diagnosis Date Noted  . Schizophrenia, paranoid type ([place]) 06/19/2011    Class: Acute  . Mentally disabled 05/26/2011    Andrew [place]  PT 01/16/2018, 2:59 PM  [facility] [address] [place], [place], [address] Phone: [phone]   Fax:  [phone]  Name: Andrew [place] MRN: [id number] Date of Birth: [birth year]

## 2018-01-18 ENCOUNTER — Ambulatory Visit: Payer: Medicare HMO

## 2018-01-18 DIAGNOSIS — M25521 Pain in right elbow: Secondary | ICD-10-CM

## 2018-01-18 DIAGNOSIS — M25621 Stiffness of right elbow, not elsewhere classified: Secondary | ICD-10-CM | POA: Diagnosis not present

## 2018-01-18 DIAGNOSIS — R600 Localized edema: Secondary | ICD-10-CM | POA: Diagnosis not present

## 2018-01-18 NOTE — Patient Instructions (Signed)
Issued   Red band attached and unattached scapula exercise stand and sitting. 10-20 reps  And when no pain and comfortable x 20 reps incr to green band

## 2018-01-18 NOTE — Therapy (Addendum)
[place], [place], [address] Phone: [phone]   Fax:  [phone]  Physical Therapy Treatment/ Discharge  Patient Details  Name: CARRON JAGGI MRN: [id number] Date of Birth: 01-19-1981 Referring Provider (PT): Crawford Givens, MD   Encounter Date: 01/18/2018  PT End of Session - 01/18/18 1419    Visit Number  11    Number of Visits  24    Date for PT Re-Evaluation  02/09/18    Authorization Type  Aetna [facility]    PT Start Time  0215    PT Stop Time  0305    PT Time Calculation (min)  50 min    Activity Tolerance  Patient tolerated treatment well;Patient limited by pain    Behavior During Therapy  [facility] for tasks assessed/performed       Past Medical History:  Diagnosis Date  . Complication of anesthesia    trouble waking up with back surgery  . Fracture    right elbow  . GERD (gastroesophageal reflux disease)   . High triglycerides   . Mentally disabled   . Previous back surgery 1999  . Schizophrenia ([place])   . Scoliosis     Past Surgical History:  Procedure Laterality Date  . BACK SURGERY    . EYE SURGERY     37 year old  . ORIF ELBOW FRACTURE Right 10/26/2017   Procedure: OPEN REDUCTION INTERNAL FIXATION DISTAL HUMERUS;  Surgeon: Altamese [place], MD;  Location: [place];  Service: Orthopedics;  Laterality: Right;  . TONSILLECTOMY AND ADENOIDECTOMY    . WISDOM TOOTH EXTRACTION      There were no vitals filed for this visit.  Subjective Assessment - 01/18/18 1419    Subjective  No pain . still stiff    Patient is accompained by:  Family member    Limitations  Lifting;House hold activities    Currently in Pain?  No/denies         [facility] PT Assessment - 01/18/18 0001      AROM   Right Elbow Flexion  112    Right Elbow Extension  -41      PROM   Right Elbow Flexion  118    Right Elbow Extension  -35                   [facility] Adult PT Treatment/Exercise - 01/18/18 0001       Shoulder Exercises: Pulleys   Flexion  3 minutes      Manual stretching for flexion and extension .   Exercises with red bands attached overhead and chest level for HEP . Red and green band issued for HEP.        PT Education - 01/18/18 1547    Education Details  Band HEP    Person(s) Educated  Patient;Parent(s)    Methods  Explanation;Demonstration;Tactile cues;Verbal cues;Handout    Comprehension  Returned demonstration;Verbalized understanding       PT Short Term Goals - 01/04/18 1453      PT SHORT TERM GOAL #1   Title  He will have full active wrist ROM    Baseline  WNL with some end range pain with supination    Status  Partially Met      PT SHORT TERM GOAL #2   Title  He will have active elbow flexion to 110 degrees    Baseline  109 today    Status  Partially Met      PT  SHORT TERM GOAL #3   Title  He will have -30 degrees active RT elbow extension    Baseline  -42 post stretch    Status  On-going        PT Long Term Goals - 01/04/18 1453      PT LONG TERM GOAL #1   Title  He will be independent with all hEP issued    Status  On-going      PT LONG TERM GOAL #2   Title  He will report able to return to cooking without help    Status  On-going      PT LONG TERM GOAL #3   Title  he will have active RT elbow flexion to 125 degrees to allow normal liftingin kitchen    Status  On-going      PT LONG TERM GOAL #4   Title  He will ahve active RT elbow extension  to allow for normal selfcare and cooking at home.     Status  On-going      PT LONG TERM GOAL #5   Title  Hewill be able to lift 20 pounds from floor and carry 100 feet  without pain    Baseline  No assessed    Status  On-going            Plan - 01/18/18 1420    Clinical Impression Statement  Small change in passive ROm . They feel they want to work with [name] due to copayment cost so I agreed will put on hold for a month andif need to follow up will do this.   We started band for shoulder and  elbow strength with red band without pain today. Advised him to stop if at all painful and adk Dr Marcelino Scot about band exercies now.      PT Treatment/Interventions  Cryotherapy;Therapeutic exercise;Therapeutic activities;Patient/family education;Manual techniques;Passive range of motion;Taping    PT Next Visit Plan  Put on hold for now due to high copayment.     PT Home Exercise Plan  Gentle elbow and wrist active and PROM     Consulted and Agree with Plan of Care  Patient    Family Member Consulted  mother       Patient will benefit from skilled therapeutic intervention in order to improve the following deficits and impairments:  Pain, Impaired UE functional use, Decreased strength, Decreased range of motion, Decreased activity tolerance, Increased edema  Visit Diagnosis: Stiffness of right elbow joint  Pain in right elbow  Localized edema     Problem List Patient Active Problem List   Diagnosis Date Noted  . Schizophrenia, paranoid type ([facility]) 06/19/2011    Class: Acute  . Mentally disabled 05/26/2011    Darrel Hoover PT 01/18/2018, 3:48 PM  [facility] [address] [place], [place], [address] Phone: [phone]   Fax:  [phone]  Name: TERIUS JACUINDE MRN: [id number] Date of Birth: [birth year]  PHYSICAL THERAPY DISCHARGE SUMMARY  Visits from Start of Care: 11  Current functional level related to goals / functional outcomes: See above . He has not returned since [name] splint acquired  And since they were going to give [name] time due to high co pay will discharge. IF MD wants return he can order more PT   Remaining deficits: See above . Unknown as he has not returned   Education / Equipment: HEP Plan: Patient agrees to discharge.  Patient goals were not met. Patient is  being discharged due to financial reasons.  ?????  Pearson Forster PT     02/28/18

## 2018-01-22 DIAGNOSIS — H5213 Myopia, bilateral: Secondary | ICD-10-CM | POA: Diagnosis not present

## 2018-01-22 DIAGNOSIS — H52223 Regular astigmatism, bilateral: Secondary | ICD-10-CM | POA: Diagnosis not present

## 2018-01-23 DIAGNOSIS — R69 Illness, unspecified: Secondary | ICD-10-CM | POA: Diagnosis not present

## 2018-01-24 DIAGNOSIS — S42471D Displaced transcondylar fracture of right humerus, subsequent encounter for fracture with routine healing: Secondary | ICD-10-CM | POA: Diagnosis not present

## 2018-01-24 DIAGNOSIS — S42401D Unspecified fracture of lower end of right humerus, subsequent encounter for fracture with routine healing: Secondary | ICD-10-CM | POA: Diagnosis not present

## 2018-01-25 DIAGNOSIS — M25621 Stiffness of right elbow, not elsewhere classified: Secondary | ICD-10-CM | POA: Diagnosis not present

## 2018-01-25 DIAGNOSIS — R69 Illness, unspecified: Secondary | ICD-10-CM | POA: Diagnosis not present

## 2018-01-25 DIAGNOSIS — S42301D Unspecified fracture of shaft of humerus, right arm, subsequent encounter for fracture with routine healing: Secondary | ICD-10-CM | POA: Diagnosis not present

## 2018-02-20 DIAGNOSIS — Z0100 Encounter for examination of eyes and vision without abnormal findings: Secondary | ICD-10-CM | POA: Diagnosis not present

## 2018-02-25 DIAGNOSIS — S42301D Unspecified fracture of shaft of humerus, right arm, subsequent encounter for fracture with routine healing: Secondary | ICD-10-CM | POA: Diagnosis not present

## 2018-02-25 DIAGNOSIS — M25621 Stiffness of right elbow, not elsewhere classified: Secondary | ICD-10-CM | POA: Diagnosis not present

## 2018-03-12 DIAGNOSIS — R69 Illness, unspecified: Secondary | ICD-10-CM | POA: Diagnosis not present

## 2018-03-21 DIAGNOSIS — M24521 Contracture, right elbow: Secondary | ICD-10-CM | POA: Diagnosis not present

## 2018-03-21 DIAGNOSIS — S42471D Displaced transcondylar fracture of right humerus, subsequent encounter for fracture with routine healing: Secondary | ICD-10-CM | POA: Diagnosis not present

## 2018-03-27 DIAGNOSIS — M25621 Stiffness of right elbow, not elsewhere classified: Secondary | ICD-10-CM | POA: Diagnosis not present

## 2018-03-27 DIAGNOSIS — S42301D Unspecified fracture of shaft of humerus, right arm, subsequent encounter for fracture with routine healing: Secondary | ICD-10-CM | POA: Diagnosis not present

## 2018-03-27 DIAGNOSIS — H6120 Impacted cerumen, unspecified ear: Secondary | ICD-10-CM | POA: Diagnosis not present

## 2018-03-27 DIAGNOSIS — L309 Dermatitis, unspecified: Secondary | ICD-10-CM | POA: Diagnosis not present

## 2018-03-27 DIAGNOSIS — S42409A Unspecified fracture of lower end of unspecified humerus, initial encounter for closed fracture: Secondary | ICD-10-CM | POA: Diagnosis not present

## 2018-04-27 DIAGNOSIS — M25621 Stiffness of right elbow, not elsewhere classified: Secondary | ICD-10-CM | POA: Diagnosis not present

## 2018-04-27 DIAGNOSIS — S42301D Unspecified fracture of shaft of humerus, right arm, subsequent encounter for fracture with routine healing: Secondary | ICD-10-CM | POA: Diagnosis not present

## 2018-05-01 DIAGNOSIS — E291 Testicular hypofunction: Secondary | ICD-10-CM | POA: Diagnosis not present

## 2018-05-01 DIAGNOSIS — M24521 Contracture, right elbow: Secondary | ICD-10-CM | POA: Diagnosis not present

## 2018-05-01 DIAGNOSIS — S42471D Displaced transcondylar fracture of right humerus, subsequent encounter for fracture with routine healing: Secondary | ICD-10-CM | POA: Diagnosis not present

## 2018-05-01 DIAGNOSIS — Z139 Encounter for screening, unspecified: Secondary | ICD-10-CM | POA: Diagnosis not present

## 2018-05-14 DIAGNOSIS — E291 Testicular hypofunction: Secondary | ICD-10-CM | POA: Diagnosis not present

## 2018-05-14 DIAGNOSIS — M24521 Contracture, right elbow: Secondary | ICD-10-CM | POA: Diagnosis not present

## 2018-05-14 DIAGNOSIS — S42401D Unspecified fracture of lower end of right humerus, subsequent encounter for fracture with routine healing: Secondary | ICD-10-CM | POA: Diagnosis not present

## 2018-05-14 DIAGNOSIS — S42471D Displaced transcondylar fracture of right humerus, subsequent encounter for fracture with routine healing: Secondary | ICD-10-CM | POA: Diagnosis not present

## 2018-05-15 DIAGNOSIS — R69 Illness, unspecified: Secondary | ICD-10-CM | POA: Diagnosis not present

## 2018-06-07 DIAGNOSIS — S42301D Unspecified fracture of shaft of humerus, right arm, subsequent encounter for fracture with routine healing: Secondary | ICD-10-CM | POA: Diagnosis not present

## 2018-06-07 DIAGNOSIS — M25621 Stiffness of right elbow, not elsewhere classified: Secondary | ICD-10-CM | POA: Diagnosis not present

## 2018-06-26 DIAGNOSIS — M47816 Spondylosis without myelopathy or radiculopathy, lumbar region: Secondary | ICD-10-CM | POA: Diagnosis not present

## 2018-06-26 DIAGNOSIS — M546 Pain in thoracic spine: Secondary | ICD-10-CM | POA: Diagnosis not present

## 2018-06-26 DIAGNOSIS — M4125 Other idiopathic scoliosis, thoracolumbar region: Secondary | ICD-10-CM | POA: Diagnosis not present

## 2018-06-26 DIAGNOSIS — G894 Chronic pain syndrome: Secondary | ICD-10-CM | POA: Diagnosis not present

## 2018-07-06 DIAGNOSIS — M25621 Stiffness of right elbow, not elsewhere classified: Secondary | ICD-10-CM | POA: Diagnosis not present

## 2018-07-06 DIAGNOSIS — S42301D Unspecified fracture of shaft of humerus, right arm, subsequent encounter for fracture with routine healing: Secondary | ICD-10-CM | POA: Diagnosis not present

## 2018-07-16 DIAGNOSIS — R69 Illness, unspecified: Secondary | ICD-10-CM | POA: Diagnosis not present

## 2018-08-06 DIAGNOSIS — M25621 Stiffness of right elbow, not elsewhere classified: Secondary | ICD-10-CM | POA: Diagnosis not present

## 2018-08-06 DIAGNOSIS — S42301D Unspecified fracture of shaft of humerus, right arm, subsequent encounter for fracture with routine healing: Secondary | ICD-10-CM | POA: Diagnosis not present

## 2018-09-11 DIAGNOSIS — R69 Illness, unspecified: Secondary | ICD-10-CM | POA: Diagnosis not present

## 2018-10-15 DIAGNOSIS — R69 Illness, unspecified: Secondary | ICD-10-CM | POA: Diagnosis not present

## 2018-11-08 DIAGNOSIS — Z1389 Encounter for screening for other disorder: Secondary | ICD-10-CM | POA: Diagnosis not present

## 2018-11-08 DIAGNOSIS — Z0000 Encounter for general adult medical examination without abnormal findings: Secondary | ICD-10-CM | POA: Diagnosis not present

## 2018-11-08 DIAGNOSIS — R69 Illness, unspecified: Secondary | ICD-10-CM | POA: Diagnosis not present

## 2018-11-08 DIAGNOSIS — E781 Pure hyperglyceridemia: Secondary | ICD-10-CM | POA: Diagnosis not present

## 2018-11-08 DIAGNOSIS — K219 Gastro-esophageal reflux disease without esophagitis: Secondary | ICD-10-CM | POA: Diagnosis not present

## 2018-11-13 DIAGNOSIS — R69 Illness, unspecified: Secondary | ICD-10-CM | POA: Diagnosis not present

## 2018-11-14 DIAGNOSIS — E781 Pure hyperglyceridemia: Secondary | ICD-10-CM | POA: Diagnosis not present

## 2018-12-29 ENCOUNTER — Other Ambulatory Visit (HOSPITAL_COMMUNITY): Payer: Self-pay | Admitting: Psychiatry

## 2019-01-01 DIAGNOSIS — G894 Chronic pain syndrome: Secondary | ICD-10-CM | POA: Diagnosis not present

## 2019-01-01 DIAGNOSIS — M546 Pain in thoracic spine: Secondary | ICD-10-CM | POA: Diagnosis not present

## 2019-01-01 DIAGNOSIS — M47816 Spondylosis without myelopathy or radiculopathy, lumbar region: Secondary | ICD-10-CM | POA: Diagnosis not present

## 2019-01-01 DIAGNOSIS — M4125 Other idiopathic scoliosis, thoracolumbar region: Secondary | ICD-10-CM | POA: Diagnosis not present

## 2019-01-03 DIAGNOSIS — R69 Illness, unspecified: Secondary | ICD-10-CM | POA: Diagnosis not present

## 2019-01-08 DIAGNOSIS — R69 Illness, unspecified: Secondary | ICD-10-CM | POA: Diagnosis not present

## 2019-03-05 DIAGNOSIS — R69 Illness, unspecified: Secondary | ICD-10-CM | POA: Diagnosis not present

## 2019-04-01 DIAGNOSIS — H5213 Myopia, bilateral: Secondary | ICD-10-CM | POA: Diagnosis not present

## 2019-04-01 DIAGNOSIS — H52223 Regular astigmatism, bilateral: Secondary | ICD-10-CM | POA: Diagnosis not present

## 2019-04-02 DIAGNOSIS — Z0100 Encounter for examination of eyes and vision without abnormal findings: Secondary | ICD-10-CM | POA: Diagnosis not present

## 2019-04-29 DIAGNOSIS — R69 Illness, unspecified: Secondary | ICD-10-CM | POA: Diagnosis not present

## 2019-05-07 DIAGNOSIS — R69 Illness, unspecified: Secondary | ICD-10-CM | POA: Diagnosis not present

## 2019-05-22 DIAGNOSIS — E781 Pure hyperglyceridemia: Secondary | ICD-10-CM | POA: Diagnosis not present

## 2019-06-17 IMAGING — CR DG CHEST 2V
2 series · 2 of 2 positions shown · non-contrast
Comparison: None.

CLINICAL DATA: No chest pain or shortness of breath

EXAM:
CHEST - 2 VIEW

[w chest lat]
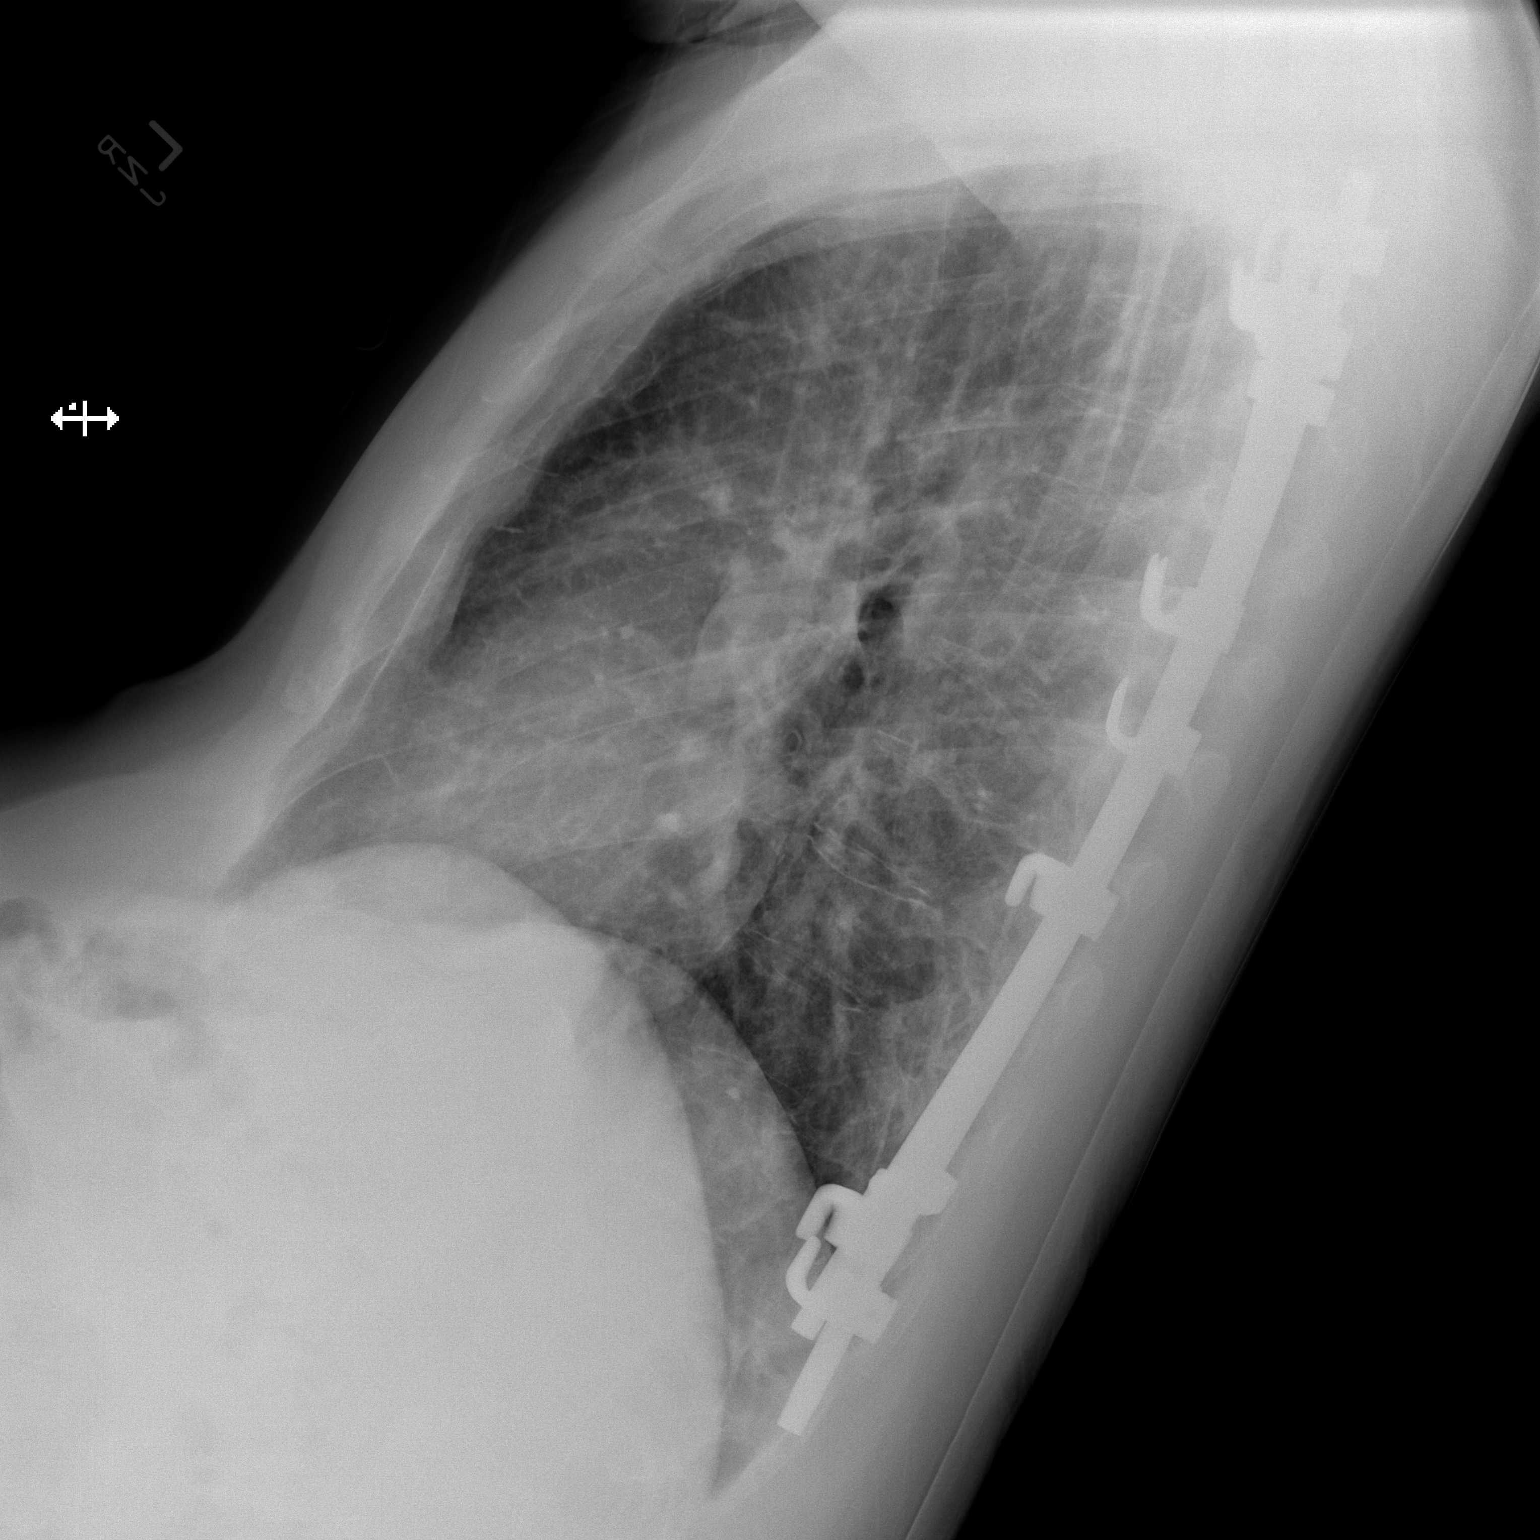

[x chest ap]
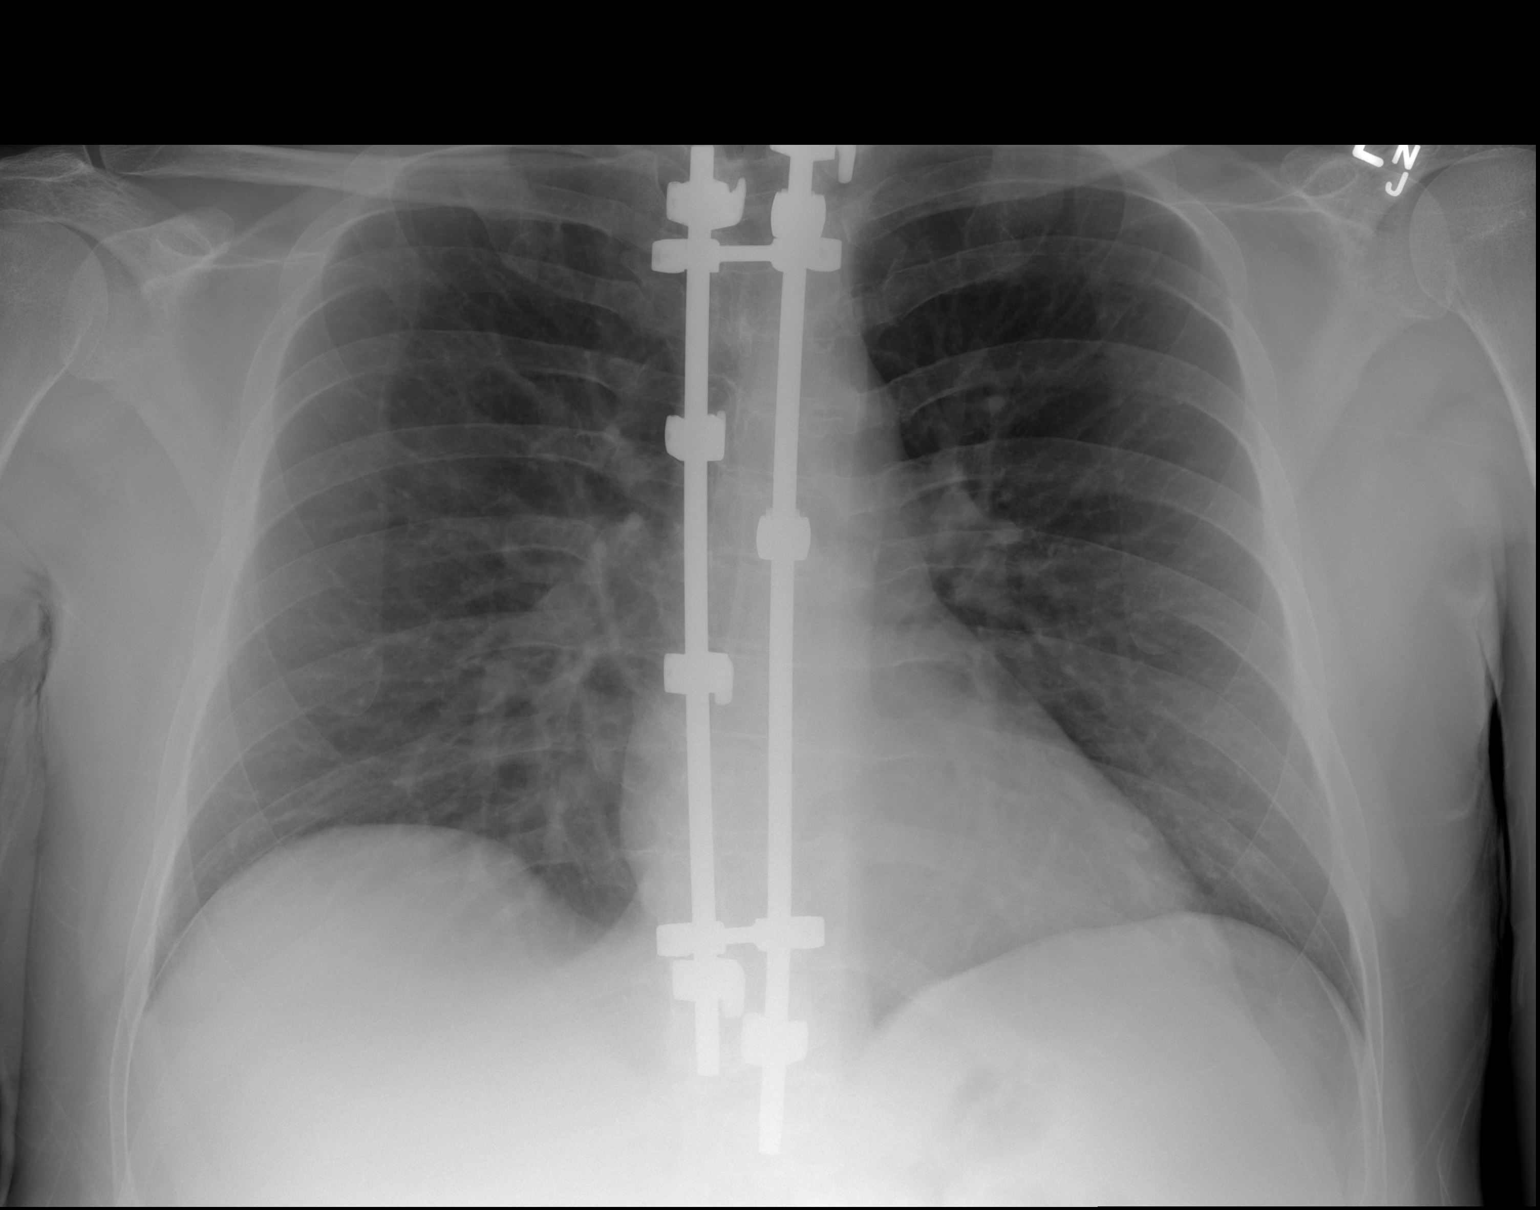

[2 of 2 positions shown; findings below may reference images not displayed]

FINDINGS: There is no focal parenchymal opacity. There is no pleural effusion
or pneumothorax. The heart and mediastinal contours are
unremarkable.

There is posterior spinal fusion hardware noted transfixing the
thoracic spine.
IMPRESSION: No active cardiopulmonary disease.

## 2019-07-02 DIAGNOSIS — R69 Illness, unspecified: Secondary | ICD-10-CM | POA: Diagnosis not present

## 2019-07-02 DIAGNOSIS — G894 Chronic pain syndrome: Secondary | ICD-10-CM | POA: Diagnosis not present

## 2019-07-02 DIAGNOSIS — M4125 Other idiopathic scoliosis, thoracolumbar region: Secondary | ICD-10-CM | POA: Diagnosis not present

## 2019-07-02 DIAGNOSIS — M546 Pain in thoracic spine: Secondary | ICD-10-CM | POA: Diagnosis not present

## 2019-07-02 DIAGNOSIS — M47816 Spondylosis without myelopathy or radiculopathy, lumbar region: Secondary | ICD-10-CM | POA: Diagnosis not present

## 2019-08-27 DIAGNOSIS — R69 Illness, unspecified: Secondary | ICD-10-CM | POA: Diagnosis not present

## 2019-10-22 DIAGNOSIS — R69 Illness, unspecified: Secondary | ICD-10-CM | POA: Diagnosis not present

## 2019-10-26 ENCOUNTER — Other Ambulatory Visit: Payer: Self-pay

## 2019-10-26 ENCOUNTER — Ambulatory Visit (HOSPITAL_COMMUNITY)
Admission: EM | Admit: 2019-10-26 | Discharge: 2019-10-27 | Disposition: A | Discharge status: Home / Self Care | Payer: Medicare HMO | Attending: Psychiatric/Mental Health | Admitting: Psychiatric/Mental Health

## 2019-10-26 ENCOUNTER — Encounter (HOSPITAL_COMMUNITY): Payer: Self-pay | Admitting: Emergency Medicine

## 2019-10-26 DIAGNOSIS — F200 Paranoid schizophrenia: Secondary | ICD-10-CM | POA: Diagnosis not present

## 2019-10-26 DIAGNOSIS — Z20822 Contact with and (suspected) exposure to covid-19: Secondary | ICD-10-CM | POA: Insufficient documentation

## 2019-10-26 DIAGNOSIS — K219 Gastro-esophageal reflux disease without esophagitis: Secondary | ICD-10-CM | POA: Diagnosis not present

## 2019-10-26 DIAGNOSIS — Z79899 Other long term (current) drug therapy: Secondary | ICD-10-CM | POA: Diagnosis not present

## 2019-10-26 DIAGNOSIS — F209 Schizophrenia, unspecified: Secondary | ICD-10-CM | POA: Diagnosis present

## 2019-10-26 DIAGNOSIS — R69 Illness, unspecified: Secondary | ICD-10-CM | POA: Diagnosis not present

## 2019-10-26 DIAGNOSIS — M419 Scoliosis, unspecified: Secondary | ICD-10-CM | POA: Insufficient documentation

## 2019-10-26 LAB — POCT URINE DRUG SCREEN - MANUAL ENTRY (I-SCREEN)
POC Amphetamine UR: NOT DETECTED
POC Buprenorphine (BUP): NOT DETECTED
POC Cocaine UR: NOT DETECTED
POC Marijuana UR: NOT DETECTED
POC Methadone UR: NOT DETECTED
POC Methamphetamine UR: NOT DETECTED
POC Morphine: POSITIVE — AB
POC Oxazepam (BZO): NOT DETECTED
POC Oxycodone UR: NOT DETECTED
POC Secobarbital (BAR): NOT DETECTED

## 2019-10-26 LAB — CBC WITH DIFFERENTIAL/PLATELET
Abs Immature Granulocytes: 0.02 10*3/uL (ref 0.00–0.07)
Basophils Absolute: 0 10*3/uL (ref 0.0–0.1)
Basophils Relative: 0 %
Eosinophils Absolute: 0.1 10*3/uL (ref 0.0–0.5)
Eosinophils Relative: 1 %
HCT: 44.6 % (ref 39.0–52.0)
Hemoglobin: 15.4 g/dL (ref 13.0–17.0)
Immature Granulocytes: 0 %
Lymphocytes Relative: 35 %
Lymphs Abs: 2.6 10*3/uL (ref 0.7–4.0)
MCH: 32 pg (ref 26.0–34.0)
MCHC: 34.5 g/dL (ref 30.0–36.0)
MCV: 92.7 fL (ref 80.0–100.0)
Monocytes Absolute: 0.6 10*3/uL (ref 0.1–1.0)
Monocytes Relative: 8 %
Neutro Abs: 4.2 10*3/uL (ref 1.7–7.7)
Neutrophils Relative %: 56 %
Platelets: 336 10*3/uL (ref 150–400)
RBC: 4.81 MIL/uL (ref 4.22–5.81)
RDW: 11.4 % — ABNORMAL LOW (ref 11.5–15.5)
WBC: 7.6 10*3/uL (ref 4.0–10.5)
nRBC: 0 % (ref 0.0–0.2)

## 2019-10-26 LAB — LIPID PANEL
Cholesterol: 135 mg/dL (ref 0–200)
HDL: 25 mg/dL — ABNORMAL LOW (ref >40)
LDL Cholesterol: 33 mg/dL (ref 0–99)
Total CHOL/HDL Ratio: 5.4 RATIO
Triglycerides: 387 mg/dL — ABNORMAL HIGH (ref <150)
VLDL: 77 mg/dL — ABNORMAL HIGH (ref 0–40)

## 2019-10-26 LAB — COMPREHENSIVE METABOLIC PANEL
ALT: 27 U/L (ref 0–44)
AST: 25 U/L (ref 15–41)
Albumin: 4.3 g/dL (ref 3.5–5.0)
Alkaline Phosphatase: 61 U/L (ref 38–126)
Anion gap: 12 (ref 5–15)
BUN: 9 mg/dL (ref 6–20)
CO2: 25 mmol/L (ref 22–32)
Calcium: 9.4 mg/dL (ref 8.9–10.3)
Chloride: 104 mmol/L (ref 98–111)
Creatinine, Ser: 0.78 mg/dL (ref 0.61–1.24)
GFR calc Af Amer: >60 mL/min (ref >60)
GFR calc non Af Amer: >60 mL/min (ref >60)
Glucose, Bld: 143 mg/dL — ABNORMAL HIGH (ref 70–99)
Potassium: 3.5 mmol/L (ref 3.5–5.1)
Sodium: 141 mmol/L (ref 135–145)
Total Bilirubin: 0.6 mg/dL (ref 0.3–1.2)
Total Protein: 7.2 g/dL (ref 6.5–8.1)

## 2019-10-26 LAB — HEMOGLOBIN A1C
Hgb A1c MFr Bld: 5.4 % (ref 4.8–5.6)
Mean Plasma Glucose: 108.28 mg/dL

## 2019-10-26 LAB — POC SARS CORONAVIRUS 2 AG: SARS Coronavirus 2 Ag: NEGATIVE

## 2019-10-26 LAB — TSH: TSH: 2.231 u[IU]/mL (ref 0.350–4.500)

## 2019-10-26 MED ORDER — MAGNESIUM HYDROXIDE 400 MG/5ML PO SUSP: 30.0000 mL | Freq: Every day | ORAL | Status: DC | PRN

## 2019-10-26 MED ORDER — ALUM & MAG HYDROXIDE-SIMETH 200-200-20 MG/5ML PO SUSP: 30.0000 mL | ORAL | Status: DC | PRN

## 2019-10-26 MED ORDER — HYDROXYZINE HCL 25 MG PO TABS
25.0000 mg | ORAL_TABLET | Freq: Three times a day (TID) | ORAL | Status: DC | PRN
  Administered 2019-10-26: 25 mg via ORAL
  Filled 2019-10-26: qty 1

## 2019-10-26 MED ORDER — CLONAZEPAM 0.5 MG PO TABS
0.5000 mg | ORAL_TABLET | Freq: Every day | ORAL | Status: DC
  Administered 2019-10-26: 0.5 mg via ORAL
  Filled 2019-10-26: qty 1

## 2019-10-26 MED ORDER — TRAZODONE HCL 50 MG PO TABS
50.0000 mg | ORAL_TABLET | Freq: Every evening | ORAL | Status: DC | PRN
  Administered 2019-10-26: 50 mg via ORAL
  Filled 2019-10-26: qty 1

## 2019-10-26 MED ORDER — RISPERIDONE 3 MG PO TABS
3.0000 mg | ORAL_TABLET | Freq: Every day | ORAL | Status: DC
  Administered 2019-10-26: 3 mg via ORAL
  Filled 2019-10-26: qty 1

## 2019-10-26 MED ORDER — ACETAMINOPHEN-CODEINE #3 300-30 MG PO TABS: 1.0000 | ORAL_TABLET | Freq: Every day | ORAL | Status: DC | PRN

## 2019-10-26 MED ORDER — NIACIN ER 500 MG PO CPCR
500.0000 mg | ORAL_CAPSULE | Freq: Every day | ORAL | Status: DC
  Administered 2019-10-26: 500 mg via ORAL
  Filled 2019-10-26 (×3): qty 1

## 2019-10-26 MED ORDER — ACETAMINOPHEN 325 MG PO TABS
650.0000 mg | ORAL_TABLET | Freq: Four times a day (QID) | ORAL | Status: DC | PRN
  Administered 2019-10-27: 650 mg via ORAL
  Filled 2019-10-26: qty 2

## 2019-10-26 NOTE — ED Notes (Signed)
Belongings stored in [place] number 25. Only belonging stored was a brown belt.

## 2019-10-26 NOTE — ED Notes (Signed)
COVID-19 test resulted negative (-)

## 2019-10-26 NOTE — ED Notes (Signed)
Pt A&O x 4, resting at present, no distress noted, calm & cooperative.  Denies SI, HI or AVH.  Monitoring for safety.

## 2019-10-26 NOTE — ED Provider Notes (Signed)
Behavioral Health Admission H&P [facility] & OBS)  Date: 10/26/19 Patient Name: Andrew Conley MRN: [id number] Chief Complaint:  Chief Complaint  Patient presents with  . Schizophrenia      Diagnoses:  Final diagnoses:  Paranoid schizophrenia ([place])    HPI: Mr. Andrew Conley is a 39 year old male with history of schizophrenia. He is seen along with his mother, with patient's permission. They report the patient threw away his medications in the trash one week ago. The family just discovered this today, but his mother reports his behavior has been concerning over the past week. The patient admits to paranoia. His mother reports that he has been accusing other family members of having conversations, and this morning he became agitated with his mother, accusing her of stealing his money. The family will not allow him to return home until medications are restarted, due to fears about his paranoia and agitation. Mr. Andrew Conley reports visual hallucinations of colors over the past week since medications were discontinued. He also reports auditory hallucinations of voices chattering. He states voices are mostly unintelligible and denies CAH. Per his mother's report, he was unable to sleep last night because he was hallucinating that his brother was having a party. The patient admits to poor sleep over the last week. Patient states he threw away medications due to not feeling he needed them. He is agreeable to overnight observation and restarting his medications at this time. He and his mother report symptoms were previously well-managed with Risperdal 3 mg QHS and Klonopin 0.5 mg QHS. Other home medications confirmed- Niacin ER 500 mg PO QHS and Tylenol #3 daily PRN back pain. Controlled substances confirmed via PDMP review. He denies SI/HI.   Total Time spent with patient: 45 minutes  Musculoskeletal  Strength & Muscle Tone: within normal limits Gait & Station: normal Patient leans: N/A  Psychiatric Specialty Exam   Presentation General Appearance: Casual  Eye Contact:Fair  Speech:Garbled (hx speech impediment)  Speech Volume:Normal  Handedness:Right   Mood and Affect  Mood:Anxious  Affect:Congruent   Thought Process  Thought Processes:Coherent  Descriptions of Associations:Intact  Orientation:Full (Time, Place and Person)  Thought Content:Paranoid Ideation  Hallucinations:Hallucinations: Auditory;Visual Description of Auditory Hallucinations: chattering voices Description of Visual Hallucinations: colors  Ideas of Reference:None  Suicidal Thoughts:Suicidal Thoughts: No  Homicidal Thoughts:Homicidal Thoughts: No   Sensorium  Memory:Immediate Fair;Recent Fair;Remote Fair  Judgment:Poor  Insight:Fair   Executive Functions  Concentration:Fair  Attention Span:Fair  [place]   Psychomotor Activity  Psychomotor Activity:No data recorded  Assets  Assets:Communication Skills;Desire for Improvement;Housing;Social Support   Sleep  Sleep:Sleep: Poor   Physical Exam Vitals and nursing note reviewed.  Constitutional:      Appearance: He is well-developed.  Pulmonary:     Effort: Pulmonary effort is normal.  Musculoskeletal:        General: Normal range of motion.  Neurological:     Mental Status: He is alert and oriented to person, place, and time.    Review of Systems  Constitutional: Negative.   Respiratory: Negative for cough and shortness of breath.   Cardiovascular: Negative for chest pain.  Neurological: Negative for tremors and headaches.  Psychiatric/Behavioral: Positive for hallucinations. Negative for depression, substance abuse and suicidal ideas. The patient is nervous/anxious and has insomnia.     Blood pressure (!) 144/90, pulse (!) 114, temperature 97.7 F (36.5 C), temperature source Temporal, resp. rate 20, height 6' (1.829 m), weight 198 lb (89.8 kg), SpO2 99 %. Body mass  index is 26.85  kg/m.  Past Psychiatric History: History of schizophrenia, currently seen outpatient by Dr. Kathie Dike. Prior hospitalizations, most recently at [facility] in 2013 for psychosis. Denies history of suicide attempts.    Is the patient at risk to self? Yes  Has the patient been a risk to self in the past 6 months? No .    Has the patient been a risk to self within the distant past? No   Is the patient a risk to others? No   Has the patient been a risk to others in the past 6 months? No   Has the patient been a risk to others within the distant past? No   Past Medical History:  Past Medical History:  Diagnosis Date  . Complication of anesthesia    trouble waking up with back surgery  . Fracture    right elbow  . GERD (gastroesophageal reflux disease)   . High triglycerides   . Mentally disabled   . Previous back surgery 1999  . Schizophrenia ([place])   . Scoliosis     Past Surgical History:  Procedure Laterality Date  . BACK SURGERY    . EYE SURGERY     39 year old  . ORIF ELBOW FRACTURE Right 10/26/2017   Procedure: OPEN REDUCTION INTERNAL FIXATION DISTAL HUMERUS;  Surgeon: Altamese Coloma, MD;  Location: [facility];  Service: Orthopedics;  Laterality: Right;  . TONSILLECTOMY AND ADENOIDECTOMY    . WISDOM TOOTH EXTRACTION      Family History:  Family History  Problem Relation Age of Onset  . Hypertension Mother   . Breast cancer Mother     Social History:  Social History   Socioeconomic History  . Marital status: Single    Spouse name: Not on file  . Number of children: Not on file  . Years of education: Not on file  . Highest education level: Not on file  Occupational History  . Not on file  Tobacco Use  . Smoking status: Never Smoker  . Smokeless tobacco: Never Used  Vaping Use  . Vaping Use: Never used  Substance and Sexual Activity  . Alcohol use: Not Currently  . Drug use: No  . Sexual activity: Never  Other Topics Concern  . Not on file  Social History Narrative  .  Not on file   Social Determinants of Health   Financial Resource Strain:   . Difficulty of Paying Living Expenses:   Food Insecurity:   . Worried About Charity fundraiser in the Last Year:   . Arboriculturist in the Last Year:   Transportation Needs:   . Film/video editor (Medical):   Marland Kitchen Lack of Transportation (Non-Medical):   Physical Activity:   . Days of Exercise per Week:   . Minutes of Exercise per Session:   Stress:   . Feeling of Stress :   Social Connections:   . Frequency of Communication with Friends and Family:   . Frequency of Social Gatherings with Friends and Family:   . Attends Religious Services:   . Active Member of Clubs or Organizations:   . Attends Archivist Meetings:   Marland Kitchen Marital Status:   Intimate Partner Violence:   . Fear of Current or Ex-Partner:   . Emotionally Abused:   Marland Kitchen Physically Abused:   . Sexually Abused:     SDOH:  SDOH Screenings   Alcohol Screen:   . Last Alcohol Screening Score (AUDIT):   Depression (  PHQ2-9):   . PHQ-2 Score:   Financial Resource Strain:   . Difficulty of Paying Living Expenses:   Food Insecurity:   . Worried About Charity fundraiser in the Last Year:   . [place] in the Last Year:   Housing:   . Last Housing Risk Score:   Physical Activity:   . Days of Exercise per Week:   . Minutes of Exercise per Session:   Social Connections:   . Frequency of Communication with Friends and Family:   . Frequency of Social Gatherings with Friends and Family:   . Attends Religious Services:   . Active Member of Clubs or Organizations:   . Attends Archivist Meetings:   Marland Kitchen Marital Status:   Stress:   . Feeling of Stress :   Tobacco Use:   . Smoking Tobacco Use:   . Smokeless Tobacco Use:   Transportation Needs:   . Lack of Transportation (Medical):   Marland Kitchen Lack of Transportation (Non-Medical):     Last Labs:  No visits with results within 6 Month(s) from this visit.  Latest known visit  with results is:  Admission on 10/26/2017, Discharged on 10/26/2017  Component Date Value Ref Range Status  . Vit D, 25-Hydroxy 10/26/2017 27.8* 30.0 - 100.0 ng/mL Final   Comment: (NOTE) Vitamin D deficiency has been defined by the [place] practice guideline as a level of serum 25-OH vitamin D less than 20 ng/mL (1,2). The Endocrine Society went on to further define vitamin D insufficiency as a level between 21 and 29 ng/mL (2). 1. IOM (Institute of Medicine). 2010. Dietary reference   intakes for calcium and D. [place]: The   [employer]. 2. Holick MF, Binkley [place], Bischoff-Ferrari HA, et al.   Evaluation, treatment, and prevention of vitamin D   deficiency: an Endocrine Society clinical practice   guideline. JCEM. 2011 Jul; 96(7):1911-30. Performed At: [facility] [place], [place] [id number] Rush Farmer MD [id number]   . Vit D, 1,25-Dihydroxy 10/26/2017 92.6* 19.9 - 79.3 pg/mL Final   Comment: (NOTE) Performed At: [facility] [place], [place] [id number] Rush Farmer MD [id number]   . PTH 10/26/2017 35  15 - 65 pg/mL Final  . Calcium, Total (PTH) 10/26/2017 9.2  8.7 - 10.2 mg/dL Final  . PTH Interp 10/26/2017 Comment   Final   Comment: (NOTE) Interpretation                 Intact PTH    Calcium                                (pg/mL)      (mg/dL) Normal                          15 - 65     8.6 - 10.2 Primary Hyperparathyroidism         >65          >10.2 Secondary Hyperparathyroidism       >65          <10.2 Non-Parathyroid Hypercalcemia       <65          >10.2 Hypoparathyroidism                  <15          <  8.6 Non-Parathyroid Hypocalcemia    15 - 65          < 8.6 Performed At: [facility] [place], [place] [id number] Rush Farmer MD [id number]   . Calcium, Ionized, Serum 10/26/2017 4.8  4.5 - 5.6 mg/dL Final   Comment:  (NOTE) Performed At: [facility] [place], [place] [id number] Rush Farmer MD [id number]   . Sodium 10/26/2017 140  135 - 145 mmol/L Final  . Potassium 10/26/2017 4.2  3.5 - 5.1 mmol/L Final  . Chloride 10/26/2017 104  98 - 111 mmol/L Final   Please note change in reference range.  . CO2 10/26/2017 27  22 - 32 mmol/L Final  . Glucose, Bld 10/26/2017 112* 70 - 99 mg/dL Final   Please note change in reference range.  . BUN 10/26/2017 6  6 - 20 mg/dL Final   Please note change in reference range.  . Creatinine, Ser 10/26/2017 0.85  0.61 - 1.24 mg/dL Final  . Calcium 10/26/2017 9.4  8.9 - 10.3 mg/dL Final  . Total Protein 10/26/2017 6.9  6.5 - 8.1 g/dL Final  . Albumin 10/26/2017 4.5  3.5 - 5.0 g/dL Final  . AST 10/26/2017 33  15 - 41 U/L Final  . ALT 10/26/2017 14  0 - 44 U/L Final   Please note change in reference range.  . Alkaline Phosphatase 10/26/2017 55  38 - 126 U/L Final  . Total Bilirubin 10/26/2017 1.8* 0.3 - 1.2 mg/dL Final  . GFR calc non Af Amer 10/26/2017 >60  >60 mL/min Final  . GFR calc Af Amer 10/26/2017 >60  >60 mL/min Final   Comment: (NOTE) The eGFR has been calculated using the CKD EPI equation. This calculation has not been validated in all clinical situations. eGFR's persistently <60 mL/min signify possible Chronic Kidney Disease.   Georgiann Hahn gap 10/26/2017 9  5 - 15 Final   Performed at [facility], [place] [address] [place], [place] [address]  . Prealbumin 10/26/2017 24.2  18 - 38 mg/dL Final   Performed at [facility], [place] [address], [place], [place] [address]  . Magnesium 10/26/2017 2.0  1.7 - 2.4 mg/dL Final   Performed at [facility] [address], [place], [place] [address]  . Phosphorus 10/26/2017 2.8  2.5 - 4.6 mg/dL Final   Performed at [facility] [address] [place], [place] [address]  . Prothrombin Time 10/26/2017 13.2  11.4 - 15.2 seconds Final   REPEATED TO VERIFY  . INR 10/26/2017  1.01   Final   Performed at [facility] [address] [place], [place] [address]  . aPTT 10/26/2017 30  24 - 36 seconds Corrected   Comment: REPEATED TO VERIFY Performed at [facility], [place] [address] [place],  [address] CORRECTED ON 07/11 AT 1155: PREVIOUSLY REPORTED AS 31   . Color, Urine 10/26/2017 YELLOW  YELLOW Final  . APPearance 10/26/2017 CLEAR  CLEAR Final  . Specific Gravity, Urine 10/26/2017 1.015  1.005 - 1.030 Final  . pH 10/26/2017 7.0  5.0 - 8.0 Final  . Glucose, UA 10/26/2017 NEGATIVE  NEGATIVE mg/dL Final  . Hgb urine dipstick 10/26/2017 SMALL* NEGATIVE Final  . Bilirubin Urine 10/26/2017 NEGATIVE  NEGATIVE Final  . Ketones, ur 10/26/2017 NEGATIVE  NEGATIVE mg/dL Final  . Protein, ur 10/26/2017 NEGATIVE  NEGATIVE mg/dL Final  . Nitrite 10/26/2017 NEGATIVE  NEGATIVE Final  .  Leukocytes, UA 10/26/2017 NEGATIVE  NEGATIVE Final  . RBC / HPF 10/26/2017 0-5  0 - 5 RBC/hpf Final  . WBC, UA 10/26/2017 0-5  0 - 5 WBC/hpf Final  . Bacteria, UA 10/26/2017 RARE* NONE SEEN Final  . Squamous Epithelial / LPF 10/26/2017 0-5  0 - 5 Final  . Mucus 10/26/2017 PRESENT   Final   Performed at [facility], [place] [address] [place], [place] [address]  . ABO/RH(D) 10/26/2017 O POS   Final  . Antibody Screen 10/26/2017 NEG   Final  . Sample Expiration 10/26/2017    Final                   Value:10/29/2017 Performed at [facility], [place] [address], [place], [place] [address]   . ABO/RH(D) 10/26/2017    Final                   Value:O POS Performed at [facility], [place] [address], [place], [place] [address]     Allergies: Morphine and related  PTA Medications: (Not in a hospital admission)   Medical Decision Making  Resume home medications: Risperdal 3 mg PO QHS for psychosis Klonopin 0.5 mg PO QHS for anxiety/sleep Tylenol #3 PO daily PRN pain   Vistaril 25 mg PO TID PRN anxiety, trazodone 50 mg PO QHS PRN insomnia  Recommendations   Based on my evaluation the patient does not appear to have an emergency medical condition.  Admitted for overnight observation. Medications restarted (see above).  Connye Burkitt, NP 10/26/19  5:20 PM

## 2019-10-26 NOTE — ED Triage Notes (Signed)
Pt states he threw  his medication in the trash about a week ago because he thought someone was tampering with his medication. Pt endorses paranoia and feeling like someone is out to get him. Pt endorses problems sleeping. Patient denies any suicidal ideation or homicidal ideation. Denies any visual or auditory hallucinations. Patient wiling to start his medication regimen again.

## 2019-10-26 NOTE — BH Assessment (Addendum)
Comprehensive Clinical Assessment (CCA) Screening, Triage and Referral Note  10/26/2019 Andrew Conley [id number]   Patient is a 39 y.o. male who presents voluntarily, accompanied by his mother, to Behavioral Health Urgent Care due to recent onset of worsening psychosis after discontinuing medications last week.   Patient sees Dr. Elsie Saas for medication management and is diagnosed with Schizophrenia, paranoid type.  He has been stable on medicine regimen since his last admission to [facility] in 2013. Patient had back surgery for scoliosis and he receives disability benefits, as he is unable to work due to associated pain.  Patient denies SI and HI.  He endorses auditory and visual hallucinations.  He reports he hears voices that are more "like chatter" and he sees "colors."  Patient is not bothered by auditory hallucinations, as they are not command type or negative in content.  Patient is unable to identify a reason for discontinuing the medications and states, "Something in my mind told me to" and he proceeded to throw them out.   At this point, he recognizes his need for the medications and he is agreeable to re-starting prescribed risperdal and clonazepam.  Patient's mother requests 23 hr observation to re-start medications.    Per [name], NP patient will be admitted to observation at [facility] for safety, stabilization and to re-start medicine in a secure environment.  Patient is in agreement with this recommendation.   Visit Diagnosis: No diagnosis found.  Patient Reported Information How did you hear about [place]? Family/Friend   Referral name: Patient's mother, Andrew Conley, is present.   Referral phone number: No data recorded Whom do you see for routine medical problems? Primary Care   Practice/Facility Name: Maurice Small MD   Practice/Facility Phone Number: No data recorded  Name of Contact: No data recorded  Contact Number: No data recorded  Contact Fax Number: No data  recorded  Prescriber Name: No data recorded  Prescriber Address (if known): No data recorded What Is the Reason for Your Visit/Call Today? Patient presents with his mother for assessment, after she noticed onset of worsening psychosis.  Patient admitted to throwing out his medications lat week.  How Long Has This Been Causing You Problems? <Week  Have You Recently Been in Any Inpatient Treatment (Hospital/Detox/Crisis Center/28-Day Program)? No   Name/Location of Program/Hospital:No data recorded  How Long Were You There? No data recorded  When Were You Discharged? No data recorded Have You Ever Received Services From [facility] Before? Yes   Who Do You [facility] at [facility]? [facility]  Have You Recently Had Any Thoughts About Hurting Yourself? No   Are You Planning to Commit Suicide/Harm Yourself At This time?  No  Have you Recently Had Thoughts About Hurting Someone [name]? No   Explanation: No data recorded Have You Used Any Alcohol or Drugs in the Past 24 Hours? No   How Long Ago Did You Use Drugs or Alcohol?  No data recorded  What Did You Use and How Much? No data recorded What Do You Feel Would Help You the Most Today? Medication  Do You Currently Have a Therapist/Psychiatrist? No   Name of Therapist/Psychiatrist: No data recorded  Have You Been Recently Discharged From Any Office Practice or Programs? No   Explanation of Discharge From Practice/Program:  No data recorded    CCA Screening Triage Referral Assessment Type of Contact: Face-to-Face   Is this Initial or Reassessment? No data recorded  Date Telepsych consult ordered in [facility]:  No data recorded  Time Telepsych consult ordered in [facility]:  No data recorded Patient Reported Information Reviewed? Yes   Patient Left Without Being Seen? No data recorded  Reason for Not Completing Assessment: No data recorded Collateral Involvement: Patient's mother provided collateral.  Does Patient Have a Court  Appointed Legal Guardian? No data recorded  Name and Contact of Legal Guardian:  No data recorded If Minor and Not Living with Parent(s), Who has Custody? No data recorded Is CPS involved or ever been involved? Never  Is APS involved or ever been involved? Never  Patient Determined To Be At Risk for Harm To Self or Others Based on Review of Patient Reported Information or Presenting Complaint? No   Method: No data recorded  Availability of Means: No data recorded  Intent: No data recorded  Notification Required: No data recorded  Additional Information for Danger to Others Potential:  No data recorded  Additional Comments for Danger to Others Potential:  No data recorded  Are There Guns or Other Weapons in Your Home?  No data recorded   Types of Guns/Weapons: No data recorded   Are These Weapons Safely Secured?                              No data recorded   Who Could Verify You Are Able To Have These Secured:    No data recorded Do You Have any Outstanding Charges, Pending Court Dates, Parole/Probation? No data recorded Contacted To Inform of Risk of Harm To Self or Others: No data recorded Location of Assessment: [facility] Assessment Services  Does Patient Present under Involuntary Commitment? No   IVC Papers Initial File Date: No data recorded  [place] of Residence: [place]  Patient Currently Receiving the Following Services: Medication Management   Determination of Need: Urgent (48 hours)   Options For Referral: [facility];Medication Management   Yetta Glassman

## 2019-10-27 MED ORDER — CLONAZEPAM 0.5 MG PO TABS: 0.5000 mg | ORAL_TABLET | Freq: Every day | ORAL | 0 refills | Status: AC

## 2019-10-27 MED ORDER — ACETAMINOPHEN-CODEINE #3 300-30 MG PO TABS: 1.0000 | ORAL_TABLET | Freq: Every day | ORAL | Status: AC | PRN

## 2019-10-27 MED ORDER — CLONAZEPAM 0.5 MG PO TABS: 0.5000 mg | ORAL_TABLET | Freq: Every day | ORAL | Status: DC

## 2019-10-27 MED ORDER — RISPERIDONE 1 MG PO TABS
1.0000 mg | ORAL_TABLET | Freq: Once | ORAL | Status: AC
  Administered 2019-10-27: 1 mg via ORAL
  Filled 2019-10-27: qty 1

## 2019-10-27 NOTE — ED Notes (Signed)
Pt sleeping at present, no distress noted, monitoring for safety. 

## 2019-10-27 NOTE — Discharge Instructions (Addendum)
Follow up with Dr. Carmelina Dane for continuing medication management. Klonopin 0.5 mg tablets #5 were called into [facility] on [address].  Keep all scheduled follow-up appointments as recommended.

## 2019-10-27 NOTE — ED Provider Notes (Signed)
FBC/OBS ASAP Discharge Summary  Date and Time: 10/27/2019 10:50 AM  Name: Andrew Conley  MRN:  [id number]   Discharge Diagnoses:  Final diagnoses:  Paranoid schizophrenia [facility](HCC)    Stay Summary: From admission H&P: Mr. Andrew Conley is a 39 year old male with history of schizophrenia. He is seen along with his mother, with patient's permission. They report the patient threw away his medications in the trash one week ago. The family just discovered this today, but his mother reports his behavior has been concerning over the past week. The patient admits to paranoia. His mother reports that he has been accusing other family members of having conversations, and this morning he became agitated with his mother, accusing her of stealing his money. The family will not allow him to return home until medications are restarted, due to fears about his paranoia and agitation. Mr. Andrew Conley reports visual hallucinations of colors over the past week since medications were discontinued. He also reports auditory hallucinations of voices chattering. He states voices are mostly unintelligible and denies CAH. Per his mother's report, he was unable to sleep last night because he was hallucinating that his brother was having a party. The patient admits to poor sleep over the last week. Patient states he threw away medications due to not feeling he needed them. He is agreeable to overnight observation and restarting his medications at this time. He and his mother report symptoms were previously well-managed with Risperdal 3 mg QHS and Klonopin 0.5 mg QHS. Other home medications confirmed- Niacin ER 500 mg PO QHS and Tylenol #3 daily PRN back pain. Controlled substances confirmed via PDMP review. He denies SI/HI.   Mr. Andrew Conley was admitted after discontinuing his medications for schizophrenia one week ago, as described above. He reported auditory and visual hallucinations on admission, and family stated he had been paranoid at home. He  [facility] and restarted on all home medications. He responded well to treatment with no adverse effects reported. He has been calm and cooperative. On assessment today, he reports AVH have decreased. He reports AH of unintelligible chattering still, but voices have quieted. He denies CAH. He shows no signs of responding to internal stimuli. He admits to some lingering paranoia that he is being watched, but states this feeling has decreased and expresses realization that he is not actually being watched. He denies any SI/HI. He appears euthymic and reports stable mood. Sleep was improved overnight. With patient's expressed consent, his mother Birdie RiddleJudy Fader [phone]) was called and updated. Patient's parents will pick him up this afternoon. Patient's mother expressed concern that patient's Klonopin prescription could not be refilled before 11/01/19 (patient threw away all medications in the trash one week ago). I have sent Klonopin 0.5 mg tablets #5 to [facility] on [address]. Patient and his mother report they have refills for other medications. He is following up with established outpatient psychiatrist Dr. Elsie SaasJonnalagadda.  Total Time spent with patient: 20 minutes  Past Psychiatric History: History of schizophrenia, currently seen outpatient by Dr. Carmelina DaneJonnalagada. Prior hospitalizations, [facility] for psychosis. Denies history of suicide attempts.    Past Medical History:  Past Medical History:  Diagnosis Date  . Complication of anesthesia    trouble waking up with back surgery  . Fracture    right elbow  . GERD (gastroesophageal reflux disease)   . High triglycerides   . Mentally disabled   . Previous back surgery 1999  . Schizophrenia (HCC)   .  Scoliosis     Past Surgical History:  Procedure Laterality Date  . BACK SURGERY    . EYE SURGERY     39 year old  . ORIF ELBOW FRACTURE Right 10/26/2017   Procedure: OPEN REDUCTION INTERNAL FIXATION  DISTAL HUMERUS;  Surgeon: Myrene Galas, MD;  Location: [facility] OR;  Service: Orthopedics;  Laterality: Right;  . TONSILLECTOMY AND ADENOIDECTOMY    . WISDOM TOOTH EXTRACTION     Family History:  Family History  Problem Relation Age of Onset  . Hypertension Mother   . Breast cancer Mother    Family Psychiatric History: Denies Social History:  Social History   Substance and Sexual Activity  Alcohol Use Yes   Comment: ocassionally     Social History   Substance and Sexual Activity  Drug Use No    Social History   Socioeconomic History  . Marital status: Single    Spouse name: Not on file  . Number of children: Not on file  . Years of education: Not on file  . Highest education level: Not on file  Occupational History  . Not on file  Tobacco Use  . Smoking status: Never Smoker  . Smokeless tobacco: Never Used  Vaping Use  . Vaping Use: Never used  Substance and Sexual Activity  . Alcohol use: Yes    Comment: ocassionally  . Drug use: No  . Sexual activity: Never  Other Topics Concern  . Not on file  Social History Narrative  . Not on file   Social Determinants of Health   Financial Resource Strain:   . Difficulty of Paying Living Expenses:   Food Insecurity:   . Worried About Programme researcher, broadcasting/film/video in the Last Year:   . Barista in the Last Year:   Transportation Needs:   . Freight forwarder (Medical):   Marland Kitchen Lack of Transportation (Non-Medical):   Physical Activity:   . Days of Exercise per Week:   . Minutes of Exercise per Session:   Stress:   . Feeling of Stress :   Social Connections:   . Frequency of Communication with Friends and Family:   . Frequency of Social Gatherings with Friends and Family:   . Attends Religious Services:   . Active Member of Clubs or Organizations:   . Attends Banker Meetings:   Marland Kitchen Marital Status:    SDOH:  SDOH Screenings   Alcohol Screen:   . Last Alcohol Screening Score (AUDIT):   Depression  (PHQ2-9):   . PHQ-2 Score:   Financial Resource Strain:   . Difficulty of Paying Living Expenses:   Food Insecurity:   . Worried About Programme researcher, broadcasting/film/video in the Last Year:   . [employer] of Food in the Last Year:   Housing:   . Last Housing Risk Score:   Physical Activity:   . Days of Exercise per Week:   . Minutes of Exercise per Session:   Social Connections:   . Frequency of Communication with Friends and Family:   . Frequency of Social Gatherings with Friends and Family:   . Attends Religious Services:   . Active Member of Clubs or Organizations:   . Attends Banker Meetings:   Marland Kitchen Marital Status:   Stress:   . Feeling of Stress :   Tobacco Use: Low Risk   . Smoking Tobacco Use: Never Smoker  . Smokeless Tobacco Use: Never Used  Transportation Needs:   . Lack  of Transportation (Medical):   Marland Kitchen Lack of Transportation (Non-Medical):     Has this patient used any form of tobacco in the last 30 days? (Cigarettes, Smokeless Tobacco, Cigars, and/or Pipes) No  Current Medications:  Current Facility-Administered Medications  Medication Dose Route Frequency Provider Last Rate Last Admin  . acetaminophen (TYLENOL) tablet 650 mg  650 mg Oral Q6H PRN Aldean Baker, NP   650 mg at 10/27/19 0848  . acetaminophen-codeine (TYLENOL #3) 300-30 MG per tablet 1 tablet  1 tablet Oral Daily PRN Aldean Baker, NP      . alum & mag hydroxide-simeth (MAALOX/MYLANTA) 200-200-20 MG/5ML suspension 30 mL  30 mL Oral Q4H PRN Aldean Baker, NP      . clonazePAM Scarlette Calico) tablet 0.5 mg  0.5 mg Oral QHS Aldean Baker, NP      . hydrOXYzine (ATARAX/VISTARIL) tablet 25 mg  25 mg Oral TID PRN Aldean Baker, NP   25 mg at 10/26/19 2113  . magnesium hydroxide (MILK OF MAGNESIA) suspension 30 mL  30 mL Oral Daily PRN Aldean Baker, NP      . niacin CR capsule 500 mg  500 mg Oral QHS Aldean Baker, NP   500 mg at 10/26/19 2303  . risperiDONE (RISPERDAL) tablet 3 mg  3 mg Oral QHS Aldean Baker,  NP   3 mg at 10/26/19 2112  . traZODone (DESYREL) tablet 50 mg  50 mg Oral QHS PRN Aldean Baker, NP   50 mg at 10/26/19 2113   Current Outpatient Medications  Medication Sig Dispense Refill  . acetaminophen-codeine (TYLENOL #3) 300-30 MG tablet Take 1 tablet by mouth daily as needed for moderate pain. 30 tablet   . clonazePAM (KLONOPIN) 0.5 MG tablet Take 0.5 mg by mouth at bedtime.    . niacin 500 MG tablet Take 500 mg by mouth at bedtime.    . risperiDONE (RISPERDAL) 3 MG tablet Take 3 mg by mouth at bedtime.   1    PTA Medications: (Not in a hospital admission)   Musculoskeletal  Strength & Muscle Tone: within normal limits Gait & Station: normal Patient leans: N/A  Psychiatric Specialty Exam  Presentation  General Appearance: Casual  Eye Contact:Good  Speech:Blocked (hx speech impediment)  Speech Volume:Normal  Handedness:Right   Mood and Affect  Mood:Euthymic  Affect:Constricted   Thought Process  Thought Processes:Coherent;Goal Directed  Descriptions of Associations:Intact  Orientation:Full (Time, Place and Person)  Thought Content:Other (comment) (decreasing paranoid ideation- patient recognizes this thought content not reality-based)  Hallucinations:Hallucinations: Auditory;Visual Description of Auditory Hallucinations: chattering voices- decreased and no CAH Description of Visual Hallucinations: colors  Ideas of Reference:None  Suicidal Thoughts:Suicidal Thoughts: No  Homicidal Thoughts:Homicidal Thoughts: No   Sensorium  Memory:Immediate Fair;Recent Fair;Remote Fair  Judgment:Fair  Insight:Fair   Executive Functions  Concentration:Good  Attention Span:Good  Recall:Good  Fund of Knowledge:Fair  Language:Good   Psychomotor Activity  Psychomotor Activity:Psychomotor Activity: Normal   Assets  Assets:Communication Skills;Desire for Improvement;Housing;Social Support   Sleep  Sleep:Sleep: Good   Physical Exam  Physical  Exam Vitals and nursing note reviewed.  Constitutional:      Appearance: He is well-developed.  Cardiovascular:     Rate and Rhythm: Normal rate.  Pulmonary:     Effort: Pulmonary effort is normal.  Neurological:     Mental Status: He is alert and oriented to person, place, and time.    Review of Systems  Constitutional: Negative.   Respiratory: Negative for  cough and shortness of breath.   Psychiatric/Behavioral: Positive for hallucinations (decreasing). Negative for depression, substance abuse and suicidal ideas. The patient is not nervous/anxious and does not have insomnia.    Blood pressure (!) 144/90, pulse (!) 114, temperature 97.7 F (36.5 C), temperature source Temporal, resp. rate 20, height 6' (1.829 m), weight 198 lb (89.8 kg), SpO2 99 %. Body mass index is 26.85 kg/m.  Demographic Factors:  Male and Caucasian  Loss Factors: NA  Historical Factors: Impulsivity  Risk Reduction Factors:   Sense of responsibility to family, Living with another person, especially a relative and Positive social support  Continued Clinical Symptoms:  Schizophrenia:   Paranoid or undifferentiated type  Cognitive Features That Contribute To Risk:  None    Suicide Risk:  Minimal: No identifiable suicidal ideation.  Patients presenting with no risk factors but with morbid ruminations; may be classified as minimal risk based on the severity of the depressive symptoms  Plan Of Care/Follow-up recommendations: Activity as tolerated. Diet as recommended by primary care physician. Keep all scheduled follow-up appointments as recommended.  Disposition: Patient poses no acute risk of harm to self or others and does not meet criteria for inpatient hospitalization. He is safe for discharge home.   Aldean Baker, NP 10/27/2019, 10:50 AM

## 2019-10-27 NOTE — ED Notes (Signed)
Pt sleeping at present, no distress noted, calm & cooperative, monitoring for safety. °

## 2019-10-27 NOTE — ED Notes (Signed)
Patient discharged home with family member. Patient denied SI/HI. Patient denied A/VH. Suicide prevention information provided and discussed with patient who stated he understood and had no questions. Patient stated he received all his belongings, clothing, toiletries, etc. Patient stated he appreciated all assistance from [facility] staff. All required discharge information given and explained to patient at discharge. Patient ambulated off unit without incident.

## 2019-10-27 NOTE — ED Notes (Signed)
Patient resting quietly on bed chair. Pleasant on approach this am. States slept well through the night. Complains of headache 3/10 this am offered tylenol. Ate fruit cup for breakfast. Denies SI/HI. Denies any auditory or visual hallucinations. Patient given toiletries for adl's. Patient ambulatory with steady gait. Pt remains safe on the unit. Will continue to monitor and assess for changes.

## 2019-11-11 DIAGNOSIS — R69 Illness, unspecified: Secondary | ICD-10-CM | POA: Diagnosis not present

## 2019-11-11 DIAGNOSIS — Z1389 Encounter for screening for other disorder: Secondary | ICD-10-CM | POA: Diagnosis not present

## 2019-11-11 DIAGNOSIS — Z0000 Encounter for general adult medical examination without abnormal findings: Secondary | ICD-10-CM | POA: Diagnosis not present

## 2019-11-11 DIAGNOSIS — K219 Gastro-esophageal reflux disease without esophagitis: Secondary | ICD-10-CM | POA: Diagnosis not present

## 2019-11-11 DIAGNOSIS — E781 Pure hyperglyceridemia: Secondary | ICD-10-CM | POA: Diagnosis not present

## 2019-12-17 DIAGNOSIS — R69 Illness, unspecified: Secondary | ICD-10-CM | POA: Diagnosis not present

## 2019-12-31 DIAGNOSIS — M4125 Other idiopathic scoliosis, thoracolumbar region: Secondary | ICD-10-CM | POA: Diagnosis not present

## 2019-12-31 DIAGNOSIS — M47816 Spondylosis without myelopathy or radiculopathy, lumbar region: Secondary | ICD-10-CM | POA: Diagnosis not present

## 2019-12-31 DIAGNOSIS — G894 Chronic pain syndrome: Secondary | ICD-10-CM | POA: Diagnosis not present

## 2019-12-31 DIAGNOSIS — M546 Pain in thoracic spine: Secondary | ICD-10-CM | POA: Diagnosis not present

## 2020-01-09 DIAGNOSIS — R69 Illness, unspecified: Secondary | ICD-10-CM | POA: Diagnosis not present

## 2020-02-10 DIAGNOSIS — R69 Illness, unspecified: Secondary | ICD-10-CM | POA: Diagnosis not present

## 2020-02-11 DIAGNOSIS — R69 Illness, unspecified: Secondary | ICD-10-CM | POA: Diagnosis not present

## 2020-04-01 DIAGNOSIS — H5213 Myopia, bilateral: Secondary | ICD-10-CM | POA: Diagnosis not present

## 2020-04-01 DIAGNOSIS — H52223 Regular astigmatism, bilateral: Secondary | ICD-10-CM | POA: Diagnosis not present

## 2020-04-03 DIAGNOSIS — Z0100 Encounter for examination of eyes and vision without abnormal findings: Secondary | ICD-10-CM | POA: Diagnosis not present

## 2020-05-05 DIAGNOSIS — R69 Illness, unspecified: Secondary | ICD-10-CM | POA: Diagnosis not present

## 2020-05-13 DIAGNOSIS — E781 Pure hyperglyceridemia: Secondary | ICD-10-CM | POA: Diagnosis not present

## 2020-05-13 DIAGNOSIS — R69 Illness, unspecified: Secondary | ICD-10-CM | POA: Diagnosis not present

## 2020-05-13 DIAGNOSIS — F250 Schizoaffective disorder, bipolar type: Secondary | ICD-10-CM | POA: Diagnosis not present

## 2020-06-30 DIAGNOSIS — R69 Illness, unspecified: Secondary | ICD-10-CM | POA: Diagnosis not present

## 2020-07-14 DIAGNOSIS — G894 Chronic pain syndrome: Secondary | ICD-10-CM | POA: Diagnosis not present

## 2020-07-14 DIAGNOSIS — M47816 Spondylosis without myelopathy or radiculopathy, lumbar region: Secondary | ICD-10-CM | POA: Diagnosis not present

## 2020-07-14 DIAGNOSIS — M4125 Other idiopathic scoliosis, thoracolumbar region: Secondary | ICD-10-CM | POA: Diagnosis not present

## 2020-07-14 DIAGNOSIS — M546 Pain in thoracic spine: Secondary | ICD-10-CM | POA: Diagnosis not present

## 2020-08-25 DIAGNOSIS — R69 Illness, unspecified: Secondary | ICD-10-CM | POA: Diagnosis not present

## 2020-10-20 DIAGNOSIS — R69 Illness, unspecified: Secondary | ICD-10-CM | POA: Diagnosis not present

## 2020-11-17 DIAGNOSIS — K219 Gastro-esophageal reflux disease without esophagitis: Secondary | ICD-10-CM | POA: Diagnosis not present

## 2020-11-17 DIAGNOSIS — F250 Schizoaffective disorder, bipolar type: Secondary | ICD-10-CM | POA: Diagnosis not present

## 2020-11-17 DIAGNOSIS — R69 Illness, unspecified: Secondary | ICD-10-CM | POA: Diagnosis not present

## 2020-11-17 DIAGNOSIS — Z1389 Encounter for screening for other disorder: Secondary | ICD-10-CM | POA: Diagnosis not present

## 2020-11-17 DIAGNOSIS — E781 Pure hyperglyceridemia: Secondary | ICD-10-CM | POA: Diagnosis not present

## 2020-11-17 DIAGNOSIS — Z0000 Encounter for general adult medical examination without abnormal findings: Secondary | ICD-10-CM | POA: Diagnosis not present

## 2021-01-11 DIAGNOSIS — F200 Paranoid schizophrenia: Secondary | ICD-10-CM | POA: Diagnosis not present

## 2021-01-11 DIAGNOSIS — R69 Illness, unspecified: Secondary | ICD-10-CM | POA: Diagnosis not present

## 2021-01-13 DIAGNOSIS — M4125 Other idiopathic scoliosis, thoracolumbar region: Secondary | ICD-10-CM | POA: Diagnosis not present

## 2021-01-13 DIAGNOSIS — M546 Pain in thoracic spine: Secondary | ICD-10-CM | POA: Diagnosis not present

## 2021-01-13 DIAGNOSIS — M47816 Spondylosis without myelopathy or radiculopathy, lumbar region: Secondary | ICD-10-CM | POA: Diagnosis not present

## 2021-01-13 DIAGNOSIS — G894 Chronic pain syndrome: Secondary | ICD-10-CM | POA: Diagnosis not present

## 2021-03-17 DIAGNOSIS — F200 Paranoid schizophrenia: Secondary | ICD-10-CM | POA: Diagnosis not present

## 2021-03-17 DIAGNOSIS — R69 Illness, unspecified: Secondary | ICD-10-CM | POA: Diagnosis not present

## 2021-04-05 DIAGNOSIS — H52223 Regular astigmatism, bilateral: Secondary | ICD-10-CM | POA: Diagnosis not present

## 2021-04-05 DIAGNOSIS — H5213 Myopia, bilateral: Secondary | ICD-10-CM | POA: Diagnosis not present

## 2021-04-13 DIAGNOSIS — Z0100 Encounter for examination of eyes and vision without abnormal findings: Secondary | ICD-10-CM | POA: Diagnosis not present

## 2021-05-06 DIAGNOSIS — Z23 Encounter for immunization: Secondary | ICD-10-CM | POA: Diagnosis not present

## 2021-05-19 DIAGNOSIS — F200 Paranoid schizophrenia: Secondary | ICD-10-CM | POA: Diagnosis not present

## 2021-05-19 DIAGNOSIS — R69 Illness, unspecified: Secondary | ICD-10-CM | POA: Diagnosis not present

## 2021-06-29 DIAGNOSIS — E781 Pure hyperglyceridemia: Secondary | ICD-10-CM | POA: Diagnosis not present

## 2021-07-07 DIAGNOSIS — M4125 Other idiopathic scoliosis, thoracolumbar region: Secondary | ICD-10-CM | POA: Diagnosis not present

## 2021-07-07 DIAGNOSIS — M546 Pain in thoracic spine: Secondary | ICD-10-CM | POA: Diagnosis not present

## 2021-07-07 DIAGNOSIS — G894 Chronic pain syndrome: Secondary | ICD-10-CM | POA: Diagnosis not present

## 2021-07-07 DIAGNOSIS — M47816 Spondylosis without myelopathy or radiculopathy, lumbar region: Secondary | ICD-10-CM | POA: Diagnosis not present

## 2021-07-20 DIAGNOSIS — R69 Illness, unspecified: Secondary | ICD-10-CM | POA: Diagnosis not present

## 2021-07-20 DIAGNOSIS — F200 Paranoid schizophrenia: Secondary | ICD-10-CM | POA: Diagnosis not present

## 2021-09-14 DIAGNOSIS — F200 Paranoid schizophrenia: Secondary | ICD-10-CM | POA: Diagnosis not present

## 2021-09-14 DIAGNOSIS — R69 Illness, unspecified: Secondary | ICD-10-CM | POA: Diagnosis not present

## 2021-11-09 DIAGNOSIS — F200 Paranoid schizophrenia: Secondary | ICD-10-CM | POA: Diagnosis not present

## 2021-11-09 DIAGNOSIS — R69 Illness, unspecified: Secondary | ICD-10-CM | POA: Diagnosis not present

## 2021-11-29 DIAGNOSIS — E781 Pure hyperglyceridemia: Secondary | ICD-10-CM | POA: Diagnosis not present

## 2021-11-29 DIAGNOSIS — R69 Illness, unspecified: Secondary | ICD-10-CM | POA: Diagnosis not present

## 2021-11-29 DIAGNOSIS — Z1331 Encounter for screening for depression: Secondary | ICD-10-CM | POA: Diagnosis not present

## 2021-11-29 DIAGNOSIS — Z0000 Encounter for general adult medical examination without abnormal findings: Secondary | ICD-10-CM | POA: Diagnosis not present

## 2021-11-29 DIAGNOSIS — F250 Schizoaffective disorder, bipolar type: Secondary | ICD-10-CM | POA: Diagnosis not present

## 2022-01-04 DIAGNOSIS — F200 Paranoid schizophrenia: Secondary | ICD-10-CM | POA: Diagnosis not present

## 2022-01-04 DIAGNOSIS — R69 Illness, unspecified: Secondary | ICD-10-CM | POA: Diagnosis not present

## 2022-01-05 DIAGNOSIS — M546 Pain in thoracic spine: Secondary | ICD-10-CM | POA: Diagnosis not present

## 2022-01-05 DIAGNOSIS — Z79891 Long term (current) use of opiate analgesic: Secondary | ICD-10-CM | POA: Diagnosis not present

## 2022-01-05 DIAGNOSIS — M4125 Other idiopathic scoliosis, thoracolumbar region: Secondary | ICD-10-CM | POA: Diagnosis not present

## 2022-01-05 DIAGNOSIS — M47816 Spondylosis without myelopathy or radiculopathy, lumbar region: Secondary | ICD-10-CM | POA: Diagnosis not present

## 2022-01-05 DIAGNOSIS — G894 Chronic pain syndrome: Secondary | ICD-10-CM | POA: Diagnosis not present

## 2022-03-29 DIAGNOSIS — R69 Illness, unspecified: Secondary | ICD-10-CM | POA: Diagnosis not present

## 2022-03-29 DIAGNOSIS — F200 Paranoid schizophrenia: Secondary | ICD-10-CM | POA: Diagnosis not present

## 2022-04-06 DIAGNOSIS — H5213 Myopia, bilateral: Secondary | ICD-10-CM | POA: Diagnosis not present

## 2022-04-06 DIAGNOSIS — H52223 Regular astigmatism, bilateral: Secondary | ICD-10-CM | POA: Diagnosis not present

## 2022-04-15 DIAGNOSIS — Z0001 Encounter for general adult medical examination with abnormal findings: Secondary | ICD-10-CM | POA: Diagnosis not present

## 2022-05-10 ENCOUNTER — Ambulatory Visit
Admission: RE | Admit: 2022-05-10 | Discharge: 2022-05-10 | Disposition: A | Discharge status: Home / Self Care | Payer: Medicare HMO | Source: Ambulatory Visit

## 2022-05-10 VITALS — BP 131/82 | HR 100 | Temp 98.0°F | Resp 18 | Ht 72.0 in | Wt 180.0 lb

## 2022-05-10 DIAGNOSIS — H1032 Unspecified acute conjunctivitis, left eye: Secondary | ICD-10-CM

## 2022-05-10 MED ORDER — POLYMYXIN B-TRIMETHOPRIM 10000-0.1 UNIT/ML-% OP SOLN: 1.0000 [drp] | Freq: Four times a day (QID) | OPHTHALMIC | 0 refills | Status: AC

## 2022-05-10 NOTE — ED Provider Notes (Signed)
[facility]    CSN: [id number] Arrival date & time: 05/10/22  1245      History   Chief Complaint Chief Complaint  Patient presents with   Conjunctivitis    Possible sinus infection - Entered by patient    HPI Andrew Conley is a 42 y.o. male.   42 year old male presents with left eye redness.  Patient indicates that his brother was diagnosed with pinkeye several days ago.  Patient indicates he woke up this morning with the left eye being red, matted shut, with purulent drainage.  He relates he has not have any pain, no vision changes, minimal upper respiratory symptoms with mild rhinitis mainly clear intermittent mild scratchy throat.  Mild intermittent cough with clear production.  No fever or chills.  Tolerating fluids well.   Conjunctivitis    Past Medical History:  Diagnosis Date   Complication of anesthesia    trouble waking up with back surgery   Fracture    right elbow   GERD (gastroesophageal reflux disease)    High triglycerides    Mentally disabled    Previous back surgery 1999   Schizophrenia [facility])    Scoliosis     Patient Active Problem List   Diagnosis Date Noted   Schizophrenia, paranoid type (HCC) 06/19/2011    Class: Acute   Mentally disabled 05/26/2011    Past Surgical History:  Procedure Laterality Date   BACK SURGERY     EYE SURGERY     42 year old   ORIF ELBOW FRACTURE Right 10/26/2017   Procedure: OPEN REDUCTION INTERNAL FIXATION DISTAL HUMERUS;  Surgeon: Myrene Galas, MD;  Location: [facility] OR;  Service: Orthopedics;  Laterality: Right;   TONSILLECTOMY AND ADENOIDECTOMY     WISDOM TOOTH EXTRACTION         Home Medications    Prior to Admission medications   Medication Sig Start Date End Date Taking? Authorizing Provider  benztropine (COGENTIN) 0.5 MG tablet Take 0.5 mg by mouth at bedtime. 03/29/22  Yes [provider]  clonazePAM (KLONOPIN) 0.5 MG tablet Take 1 tablet (0.5 mg total) by mouth at bedtime.  10/27/19  Yes Aldean Baker, NP  niacin 500 MG tablet Take 500 mg by mouth at bedtime.   Yes [provider]  risperiDONE (RISPERDAL) 3 MG tablet Take 3 mg by mouth at bedtime.  10/11/17  Yes [provider]  trimethoprim-polymyxin b (POLYTRIM) ophthalmic solution Place 1 drop into the left eye every 6 (six) hours. 05/10/22  Yes Ellsworth Lennox, PA-C  acetaminophen-codeine (TYLENOL #3) 300-30 MG tablet Take 1 tablet by mouth daily as needed for moderate pain. 10/27/19   Aldean Baker, NP  clonazePAM (KLONOPIN) 0.5 MG tablet Take 1 tablet (0.5 mg total) by mouth at bedtime. 10/27/19   Aldean Baker, NP    Family History Family History  Problem Relation Age of Onset   Hypertension Mother    Breast cancer Mother     Social History Social History   Tobacco Use   Smoking status: Never   Smokeless tobacco: Never  Vaping Use   Vaping Use: Never used  Substance Use Topics   Alcohol use: Not Currently    Comment: ocassionally   Drug use: No     Allergies   Morphine and related   Review of Systems Review of Systems  Eyes:  Positive for redness (left eye).     Physical Exam Triage Vital Signs ED Triage Vitals  Enc Vitals Group  BP 05/10/22 1253 131/82     Pulse Rate 05/10/22 1253 100     Resp 05/10/22 1253 18     Temp 05/10/22 1253 98 F (36.7 C)     Temp Source 05/10/22 1253 Oral     SpO2 05/10/22 1253 96 %     Weight 05/10/22 1250 180 lb (81.6 kg)     Height 05/10/22 1250 6' (1.829 m)     Head Circumference --      Peak Flow --      Pain Score 05/10/22 1250 0     Pain Loc --      Pain Edu? --      Excl. in [place]? --    No data found.  Updated Vital Signs BP 131/82 (BP Location: Left Arm)   Pulse 100   Temp 98 F (36.7 C) (Oral)   Resp 18   Ht 6' (1.829 m)   Wt 180 lb (81.6 kg)   SpO2 96%   BMI 24.41 kg/m   Visual Acuity Right Eye Distance:   Left Eye Distance:   Bilateral Distance:    Right Eye Near:   Left Eye Near:    Bilateral  Near:     Physical Exam Constitutional:      Appearance: Normal appearance.  HENT:     Right Ear: Tympanic membrane and ear canal normal.     Left Ear: Tympanic membrane and ear canal normal.     Mouth/Throat:     Mouth: Mucous membranes are moist.     Pharynx: Oropharynx is clear.  Eyes:     General: Lids are normal.        Left eye: Discharge (green, copious) present.    Comments: Eyes: Conjunctiva with redness present.  Neurological:     Mental Status: He is alert.      UC Treatments / Results  Labs (all labs ordered are listed, but only abnormal results are displayed) Labs Reviewed - No data to display  EKG   Radiology No results found.  Procedures Procedures (including critical care time)  Medications Ordered in UC Medications - No data to display  Initial Impression / Assessment and Plan / UC Course  I have reviewed the triage vital signs and the nursing notes.  Pertinent labs & imaging results that were available during my care of the patient were reviewed by me and considered in my medical decision making (see chart for details).    Plan: The diagnosis will be treated with the following: 1.  Bilateral conjunctivitis left eye: A.  Polytrim ophthalmic solution, 1 drop every 6 hours to treat the infection. B.  Advised frequent handwashing to prevent spread to family members and the other eye. 2.  Advised follow-up PCP or return to urgent care if symptoms fail to improve. Final Clinical Impressions(s) / UC Diagnoses   Final diagnoses:  Acute bacterial conjunctivitis of left eye     Discharge Instructions      Advised to use the Polytrim eyedrops, 1 drop left eye 4 times a day for the next several days to the condition clears.  Advised to wash hands frequently to prevent infection of the other eye and prevent spread to other family members.  Advised follow-up PCP or return to urgent care if symptoms fail to improve.    ED Prescriptions      Medication Sig Dispense Auth. Provider   trimethoprim-polymyxin b (POLYTRIM) ophthalmic solution Place 1 drop into the left eye every 6 (six) hours.  10 mL Nyoka Lint, PA-C      PDMP not reviewed this encounter.   Nyoka Lint, PA-C 05/10/22 1307

## 2022-05-10 NOTE — ED Triage Notes (Signed)
Pt states that he has some redness and drainage of his left eye. X1 day

## 2022-05-10 NOTE — Discharge Instructions (Signed)
Advised to use the Polytrim eyedrops, 1 drop left eye 4 times a day for the next several days to the condition clears.  Advised to wash hands frequently to prevent infection of the other eye and prevent spread to other family members.  Advised follow-up PCP or return to urgent care if symptoms fail to improve.

## 2022-06-06 DIAGNOSIS — E781 Pure hyperglyceridemia: Secondary | ICD-10-CM | POA: Diagnosis not present

## 2022-06-21 DIAGNOSIS — F200 Paranoid schizophrenia: Secondary | ICD-10-CM | POA: Diagnosis not present

## 2022-06-21 DIAGNOSIS — R69 Illness, unspecified: Secondary | ICD-10-CM | POA: Diagnosis not present

## 2022-07-06 ENCOUNTER — Other Ambulatory Visit (HOSPITAL_COMMUNITY): Payer: Self-pay

## 2022-07-06 DIAGNOSIS — M4125 Other idiopathic scoliosis, thoracolumbar region: Secondary | ICD-10-CM | POA: Diagnosis not present

## 2022-07-06 DIAGNOSIS — G894 Chronic pain syndrome: Secondary | ICD-10-CM | POA: Diagnosis not present

## 2022-07-06 DIAGNOSIS — M546 Pain in thoracic spine: Secondary | ICD-10-CM | POA: Diagnosis not present

## 2022-07-06 DIAGNOSIS — M47816 Spondylosis without myelopathy or radiculopathy, lumbar region: Secondary | ICD-10-CM | POA: Diagnosis not present

## 2022-07-06 MED ORDER — ACETAMINOPHEN-CODEINE 300-30 MG PO TABS
ORAL_TABLET | ORAL | 2 refills | Status: AC
  Filled 2022-07-06: qty 120, 30d supply, fill #0
  Filled 2022-11-03: qty 120, 30d supply, fill #1

## 2022-07-07 ENCOUNTER — Other Ambulatory Visit (HOSPITAL_COMMUNITY): Payer: Self-pay

## 2022-09-13 DIAGNOSIS — F200 Paranoid schizophrenia: Secondary | ICD-10-CM | POA: Diagnosis not present

## 2022-11-03 ENCOUNTER — Other Ambulatory Visit: Payer: Self-pay

## 2022-12-06 DIAGNOSIS — F200 Paranoid schizophrenia: Secondary | ICD-10-CM | POA: Diagnosis not present

## 2023-01-04 ENCOUNTER — Other Ambulatory Visit (HOSPITAL_COMMUNITY): Payer: Self-pay

## 2023-01-04 DIAGNOSIS — M546 Pain in thoracic spine: Secondary | ICD-10-CM | POA: Diagnosis not present

## 2023-01-04 DIAGNOSIS — M4125 Other idiopathic scoliosis, thoracolumbar region: Secondary | ICD-10-CM | POA: Diagnosis not present

## 2023-01-04 DIAGNOSIS — G894 Chronic pain syndrome: Secondary | ICD-10-CM | POA: Diagnosis not present

## 2023-01-04 DIAGNOSIS — M47816 Spondylosis without myelopathy or radiculopathy, lumbar region: Secondary | ICD-10-CM | POA: Diagnosis not present

## 2023-01-04 DIAGNOSIS — Z79891 Long term (current) use of opiate analgesic: Secondary | ICD-10-CM | POA: Diagnosis not present

## 2023-01-04 MED ORDER — ACETAMINOPHEN-CODEINE 300-30 MG PO TABS
1.0000 | ORAL_TABLET | Freq: Four times a day (QID) | ORAL | 2 refills | Status: DC | PRN
  Filled 2023-01-04: qty 120, 30d supply, fill #0
  Filled 2023-04-05 – 2023-04-06 (×2): qty 120, 30d supply, fill #1

## 2023-02-28 DIAGNOSIS — F200 Paranoid schizophrenia: Secondary | ICD-10-CM | POA: Diagnosis not present

## 2023-04-06 ENCOUNTER — Other Ambulatory Visit (HOSPITAL_COMMUNITY): Payer: Self-pay

## 2023-04-24 DIAGNOSIS — H52223 Regular astigmatism, bilateral: Secondary | ICD-10-CM | POA: Diagnosis not present

## 2023-04-24 DIAGNOSIS — H5213 Myopia, bilateral: Secondary | ICD-10-CM | POA: Diagnosis not present

## 2023-05-23 DIAGNOSIS — F200 Paranoid schizophrenia: Secondary | ICD-10-CM | POA: Diagnosis not present

## 2023-07-05 ENCOUNTER — Other Ambulatory Visit (HOSPITAL_COMMUNITY): Payer: Self-pay

## 2023-07-05 DIAGNOSIS — G894 Chronic pain syndrome: Secondary | ICD-10-CM | POA: Diagnosis not present

## 2023-07-05 DIAGNOSIS — M47816 Spondylosis without myelopathy or radiculopathy, lumbar region: Secondary | ICD-10-CM | POA: Diagnosis not present

## 2023-07-05 DIAGNOSIS — M546 Pain in thoracic spine: Secondary | ICD-10-CM | POA: Diagnosis not present

## 2023-07-05 DIAGNOSIS — M4125 Other idiopathic scoliosis, thoracolumbar region: Secondary | ICD-10-CM | POA: Diagnosis not present

## 2023-07-05 MED ORDER — ACETAMINOPHEN-CODEINE 300-30 MG PO TABS
1.0000 | ORAL_TABLET | Freq: Four times a day (QID) | ORAL | 2 refills | Status: AC | PRN
  Filled 2023-07-05: qty 3, 1d supply, fill #0
  Filled 2023-07-05: qty 117, 29d supply, fill #0
  Filled 2023-09-22: qty 120, 30d supply, fill #1

## 2023-08-08 DIAGNOSIS — F200 Paranoid schizophrenia: Secondary | ICD-10-CM | POA: Diagnosis not present

## 2023-08-14 ENCOUNTER — Ambulatory Visit
Admission: RE | Admit: 2023-08-14 | Discharge: 2023-08-14 | Disposition: A | Discharge status: Home / Self Care | Payer: Self-pay | Source: Ambulatory Visit

## 2023-08-14 VITALS — BP 122/80 | HR 73 | Temp 97.5°F | Resp 18 | Ht 72.0 in | Wt 185.0 lb

## 2023-08-14 DIAGNOSIS — J302 Other seasonal allergic rhinitis: Secondary | ICD-10-CM | POA: Diagnosis not present

## 2023-08-14 DIAGNOSIS — R0981 Nasal congestion: Secondary | ICD-10-CM | POA: Diagnosis not present

## 2023-08-14 NOTE — Discharge Instructions (Signed)
 Take daily allergy med for symptom management(switch to xyzal instead of zyrtec) and see if this helps with symptoms, can try vicks nasal inhaler to see if that helps with nasal congestion Follow up with ENT for further evaluation of allergy management-call for appt

## 2023-08-14 NOTE — ED Provider Notes (Signed)
 [facility]    CSN: [id number] Arrival date & time: 08/14/23  1148      History   Chief Complaint Chief Complaint  Patient presents with   Nasal Congestion    Entered by patient    HPI Andrew Conley is a 43 y.o. male.   43 year old male pt, Andrew Conley, presents to urgent care for evaluation of nasal congestion x 3 weeks. Pt states he has allergies has been taking Coricidin HBP and zyrtec without relief. Pt denies any known illness exposure, afebrile.  PMH: Mentally disabled, schizophrenia  The history is provided by a caregiver, a relative and the patient. No language interpreter was used.    Past Medical History:  Diagnosis Date   Complication of anesthesia    trouble waking up with back surgery   Fracture    right elbow   GERD (gastroesophageal reflux disease)    High triglycerides    Mentally disabled    Previous back surgery 1999   Schizophrenia [facility])    Scoliosis     Patient Active Problem List   Diagnosis Date Noted   Nasal congestion 08/14/2023   Seasonal allergies 08/14/2023   Schizophrenia, paranoid type (HCC) 06/19/2011    Class: Acute   Mentally disabled 05/26/2011    Past Surgical History:  Procedure Laterality Date   BACK SURGERY     EYE SURGERY     43 year old   ORIF ELBOW FRACTURE Right 10/26/2017   Procedure: OPEN REDUCTION INTERNAL FIXATION DISTAL HUMERUS;  Surgeon: Hardy Lia, MD;  Location: [facility] OR;  Service: Orthopedics;  Laterality: Right;   TONSILLECTOMY AND ADENOIDECTOMY     WISDOM TOOTH EXTRACTION         Home Medications    Prior to Admission medications   Medication Sig Start Date End Date Taking? Authorizing Provider  niacin  (VITAMIN B3) 500 MG ER tablet Take by mouth. 07/08/23  Yes [provider]  risperidone  (RISPERDAL ) 4 MG tablet Take 4 mg by mouth at bedtime. 07/08/23  Yes [provider]  acetaminophen -codeine  (TYLENOL  #3) 300-30 MG tablet Take 1 tablet by mouth daily as needed  for moderate pain. 10/27/19   Sykes, Janet E, NP  acetaminophen -codeine  (TYLENOL  #3) 300-30 MG tablet Take 1 tablet by mouth 4 times a day as needed for pain 07/06/22     acetaminophen -codeine  (TYLENOL  #3) 300-30 MG tablet Take 1 tablet by mouth 4 (four) times daily as needed for pain. 07/05/23     benztropine  (COGENTIN ) 0.5 MG tablet Take 0.5 mg by mouth at bedtime. 03/29/22   [provider]  clonazePAM  (KLONOPIN ) 0.5 MG tablet Take 1 tablet (0.5 mg total) by mouth at bedtime. 10/27/19   Rochell Chroman, NP  clonazePAM  (KLONOPIN ) 0.5 MG tablet Take 1 tablet (0.5 mg total) by mouth at bedtime. 10/27/19   Rochell Chroman, NP  niacin  500 MG tablet Take 500 mg by mouth at bedtime.    [provider]  risperiDONE  (RISPERDAL ) 3 MG tablet Take 3 mg by mouth at bedtime.  10/11/17   [provider]  trimethoprim -polymyxin b  (POLYTRIM ) ophthalmic solution Place 1 drop into the left eye every 6 (six) hours. 05/10/22   Gretel Leaven, PA-C    Family History Family History  Problem Relation Age of Onset   Hypertension Mother    Breast cancer Mother     Social History Social History   Tobacco Use   Smoking status: Never    Passive exposure: Never  Smokeless tobacco: Never  Vaping Use   Vaping status: Never Used  Substance Use Topics   Alcohol use: Not Currently    Comment: ocassionally   Drug use: No     Allergies   Morphine and codeine    Review of Systems Review of Systems  Constitutional:  Negative for fever.  HENT:  Positive for congestion and postnasal drip. Negative for facial swelling.   Respiratory:  Negative for cough and shortness of breath.   Cardiovascular:  Negative for chest pain and palpitations.  Gastrointestinal:  Negative for abdominal pain and nausea.  All other systems reviewed and are negative.    Physical Exam Triage Vital Signs ED Triage Vitals  Encounter Vitals Group     BP      Systolic BP Percentile      Diastolic BP Percentile       Pulse      Resp      Temp      Temp src      SpO2      Weight      Height      Head Circumference      Peak Flow      Pain Score      Pain Loc      Pain Education      Exclude from Growth Chart    No data found.  Updated Vital Signs BP 122/80 (BP Location: Left Arm)   Pulse 73   Temp (!) 97.5 F (36.4 C) (Oral)   Resp 18   Ht 6' (1.829 m)   Wt 185 lb (83.9 kg)   SpO2 97%   BMI 25.09 kg/m   Visual Acuity Right Eye Distance:   Left Eye Distance:   Bilateral Distance:    Right Eye Near:   Left Eye Near:    Bilateral Near:     Physical Exam Vitals and nursing note reviewed.  Constitutional:      General: He is not in acute distress.    Appearance: He is well-developed and well-groomed. He is not ill-appearing or toxic-appearing.  HENT:     Head: Normocephalic and atraumatic.     Right Ear: Tympanic membrane is retracted.     Left Ear: Tympanic membrane is retracted.     Nose: Congestion present.     Right Turbinates: Enlarged.     Left Turbinates: Enlarged.     Mouth/Throat:     Lips: Pink.     Mouth: Mucous membranes are moist.     Pharynx: Oropharynx is clear. Uvula midline.  Eyes:     General: Lids are normal.     Conjunctiva/sclera: Conjunctivae normal.     Pupils: Pupils are equal, round, and reactive to light.  Cardiovascular:     Rate and Rhythm: Normal rate and regular rhythm.     Heart sounds: Normal heart sounds. No murmur heard. Pulmonary:     Effort: Pulmonary effort is normal. No respiratory distress.     Breath sounds: Normal breath sounds and air entry. No decreased breath sounds or wheezing.  Abdominal:     General: There is no distension.     Palpations: Abdomen is soft.     Tenderness: There is no abdominal tenderness.  Musculoskeletal:        General: No swelling. Normal range of motion.     Cervical back: Normal range of motion and neck supple.  Skin:    General: Skin is warm and dry.     Capillary  Refill: Capillary refill takes  less than 2 seconds.     Findings: No rash.  Neurological:     General: No focal deficit present.     Mental Status: He is alert and oriented to person, place, and time.     GCS: GCS eye subscore is 4. GCS verbal subscore is 5. GCS motor subscore is 6.     Cranial Nerves: No cranial nerve deficit.     Sensory: No sensory deficit.  Psychiatric:        Attention and Perception: Attention normal.        Mood and Affect: Mood normal.        Speech: Speech normal.        Behavior: Behavior normal. Behavior is cooperative.      UC Treatments / Results  Labs (all labs ordered are listed, but only abnormal results are displayed) Labs Reviewed - No data to display  EKG   Radiology No results found.  Procedures Procedures (including critical care time)  Medications Ordered in UC Medications - No data to display  Initial Impression / Assessment and Plan / UC Course  I have reviewed the triage vital signs and the nursing notes.  Pertinent labs & imaging results that were available during my care of the patient were reviewed by me and considered in my medical decision making (see chart for details).    Discussed exam findings and plan of care with patient, strict go to ER precautions given.   Patient verbalized understanding to this provider.  Ddx: Nasal congestion, allergies Final Clinical Impressions(s) / UC Diagnoses   Final diagnoses:  Nasal congestion  Seasonal allergies     Discharge Instructions      Take daily allergy med for symptom management(switch to xyzal instead of zyrtec) and see if this helps with symptoms, can try vicks nasal inhaler to see if that helps with nasal congestion Follow up with ENT for further evaluation of allergy management-call for appt      ED Prescriptions   None    PDMP not reviewed this encounter.   Peter [employer], NP 08/14/23 1254

## 2023-08-14 NOTE — ED Triage Notes (Signed)
 Pt c/o nasal congestion x 3 weeks. Pt mom states Zyrtec is the only OTC med she has found that can be taken with his psych meds.

## 2023-09-25 ENCOUNTER — Other Ambulatory Visit: Payer: Self-pay

## 2023-09-25 ENCOUNTER — Other Ambulatory Visit (HOSPITAL_COMMUNITY): Payer: Self-pay

## 2023-09-28 ENCOUNTER — Other Ambulatory Visit (HOSPITAL_COMMUNITY): Payer: Self-pay

## 2023-10-30 DIAGNOSIS — F200 Paranoid schizophrenia: Secondary | ICD-10-CM | POA: Diagnosis not present

## 2024-01-03 ENCOUNTER — Other Ambulatory Visit (HOSPITAL_COMMUNITY): Payer: Self-pay

## 2024-01-03 DIAGNOSIS — M546 Pain in thoracic spine: Secondary | ICD-10-CM | POA: Diagnosis not present

## 2024-01-03 DIAGNOSIS — G894 Chronic pain syndrome: Secondary | ICD-10-CM | POA: Diagnosis not present

## 2024-01-03 DIAGNOSIS — M47816 Spondylosis without myelopathy or radiculopathy, lumbar region: Secondary | ICD-10-CM | POA: Diagnosis not present

## 2024-01-03 DIAGNOSIS — M4125 Other idiopathic scoliosis, thoracolumbar region: Secondary | ICD-10-CM | POA: Diagnosis not present

## 2024-01-03 MED ORDER — ACETAMINOPHEN-CODEINE 300-30 MG PO TABS
1.0000 | ORAL_TABLET | Freq: Four times a day (QID) | ORAL | 2 refills | Status: AC | PRN
  Filled 2024-01-03: qty 120, 30d supply, fill #0
  Filled 2024-03-20: qty 120, 30d supply, fill #1
  Filled 2024-04-27: qty 120, 30d supply, fill #2

## 2024-01-22 DIAGNOSIS — F200 Paranoid schizophrenia: Secondary | ICD-10-CM | POA: Diagnosis not present

## 2024-03-20 ENCOUNTER — Other Ambulatory Visit: Payer: Self-pay

## 2024-04-28 ENCOUNTER — Other Ambulatory Visit: Payer: Self-pay
